# Patient Record
Sex: Female | Born: 1980 | Race: Black or African American | Hispanic: No | Marital: Single | Smoking: Never smoker
Health system: Southern US, Community
[De-identification: ages and names within clinical notes are randomized; demographics above are authoritative.]

## PROBLEM LIST (undated history)

## (undated) DIAGNOSIS — E0500 Thyrotoxicosis with diffuse goiter without thyrotoxic crisis or storm: Secondary | ICD-10-CM

## (undated) HISTORY — PX: LEG SURGERY: SHX1003

## (undated) HISTORY — PX: HAND SURGERY: SHX662

---

## 2006-04-18 DIAGNOSIS — O24419 Gestational diabetes mellitus in pregnancy, unspecified control: Secondary | ICD-10-CM

## 2013-06-05 ENCOUNTER — Emergency Department: Payer: Self-pay | Admitting: Emergency Medicine

## 2015-04-19 DIAGNOSIS — E0500 Thyrotoxicosis with diffuse goiter without thyrotoxic crisis or storm: Secondary | ICD-10-CM

## 2015-04-19 HISTORY — DX: Thyrotoxicosis with diffuse goiter without thyrotoxic crisis or storm: E05.00

## 2017-12-04 ENCOUNTER — Other Ambulatory Visit (INDEPENDENT_AMBULATORY_CARE_PROVIDER_SITE_OTHER): Payer: Medicaid Other

## 2017-12-04 ENCOUNTER — Ambulatory Visit (INDEPENDENT_AMBULATORY_CARE_PROVIDER_SITE_OTHER): Payer: Medicaid Other | Admitting: Obstetrics and Gynecology

## 2017-12-04 VITALS — BP 144/88 | HR 71 | Ht 67.0 in | Wt 187.4 lb

## 2017-12-04 DIAGNOSIS — N8312 Corpus luteum cyst of left ovary: Secondary | ICD-10-CM

## 2017-12-04 DIAGNOSIS — Z3687 Encounter for antenatal screening for uncertain dates: Secondary | ICD-10-CM

## 2017-12-04 DIAGNOSIS — T7589XA Other specified effects of external causes, initial encounter: Secondary | ICD-10-CM

## 2017-12-04 DIAGNOSIS — O219 Vomiting of pregnancy, unspecified: Secondary | ICD-10-CM

## 2017-12-04 DIAGNOSIS — Z3A09 9 weeks gestation of pregnancy: Secondary | ICD-10-CM | POA: Diagnosis not present

## 2017-12-04 DIAGNOSIS — E0590 Thyrotoxicosis, unspecified without thyrotoxic crisis or storm: Secondary | ICD-10-CM

## 2017-12-04 DIAGNOSIS — O3411 Maternal care for benign tumor of corpus uteri, first trimester: Secondary | ICD-10-CM

## 2017-12-04 DIAGNOSIS — Z202 Contact with and (suspected) exposure to infections with a predominantly sexual mode of transmission: Secondary | ICD-10-CM

## 2017-12-04 DIAGNOSIS — Z3481 Encounter for supervision of other normal pregnancy, first trimester: Secondary | ICD-10-CM

## 2017-12-04 MED ORDER — DOXYLAMINE-PYRIDOXINE 10-10 MG PO TBEC: 10.0000 mg | DELAYED_RELEASE_TABLET | Freq: Every day | ORAL | 1 refills | Status: DC

## 2017-12-04 MED ORDER — CITRANATAL BLOOM 90-1 MG PO TABS: 1.0000 mg | ORAL_TABLET | Freq: Every day | ORAL | 11 refills | Status: DC

## 2017-12-04 NOTE — Progress Notes (Signed)
Aimee Nguyen presents for NOB nurse interview visit. Pregnancy confirmation done at [facility].  G- 6.  P-5005. LMP- 10/03/2017 EDD-06/20/2018. 8 6/7. Dating scan ordered.  Pregnancy education material explained and given. _POS__ cats in the home.  Toxo ordered. NOB labs ordered. Thyroid panel ordered d/t pt states her pcp says she has hyperthyroidism.  HIV labs and Drug screen were explained and ordered. PNV encouraged. Pt requests a small pnv. Citra natal bloom erx. Pos for N/V- diclegis erx. Last pap 2018 - wnl.  FMLA form signed. Financial policy reviewed. Genetic screening options discussed. Genetic testing: Unsure.  Pt may discuss with provider. Pt. To follow up with provider in _3_ weeks for NOB physical.  All questions answered.

## 2017-12-04 NOTE — Patient Instructions (Signed)
WHAT OB PATIENTS CAN EXPECT   Confirmation of pregnancy and ultrasound ordered if medically indicated-[redacted] weeks gestation  New OB (NOB) intake with nurse and New OB (NOB) labs- [redacted] weeks gestation  New OB (NOB) physical examination with provider- 11/[redacted] weeks gestation  Flu vaccine-[redacted] weeks gestation  Anatomy scan-[redacted] weeks gestation  Glucose tolerance test, blood work to test for anemia, T-dap vaccine-[redacted] weeks gestation  Vaginal swabs/cultures-STD/Group B strep-[redacted] weeks gestation  Appointments every 4 weeks until 28 weeks  Every 2 weeks from 28 weeks until 36 weeks  Weekly visits from 36 weeks until delivery  Morning Sickness Morning sickness is when you feel sick to your stomach (nauseous) during pregnancy. You may feel sick to your stomach and throw up (vomit). You may feel sick in the morning, but you can feel this way any time of day. Some women feel very sick to their stomach and cannot stop throwing up (hyperemesis gravidarum). Follow these instructions at home:  Only take medicines as told by your doctor.  Take multivitamins as told by your doctor. Taking multivitamins before getting pregnant can stop or lessen the harshness of morning sickness.  Eat dry toast or unsalted crackers before getting out of bed.  Eat 5 to 6 small meals a day.  Eat dry and bland foods like rice and baked potatoes.  Do not drink liquids with meals. Drink between meals.  Do not eat greasy, fatty, or spicy foods.  Have someone cook for you if the smell of food causes you to feel sick or throw up.  If you feel sick to your stomach after taking prenatal vitamins, take them at night or with a snack.  Eat protein when you need a snack (nuts, yogurt, cheese).  Eat unsweetened gelatins for dessert.  Wear a bracelet used for sea sickness (acupressure wristband).  Go to a doctor that puts thin needles into certain body points (acupuncture) to improve how you feel.  Do not smoke.  Use a  humidifier to keep the air in your house free of odors.  Get lots of fresh air. Contact a doctor if:  You need medicine to feel better.  You feel dizzy or lightheaded.  You are losing weight. Get help right away if:  You feel very sick to your stomach and cannot stop throwing up.  You pass out (faint). This information is not intended to replace advice given to you by your health care provider. Make sure you discuss any questions you have with your health care provider. Document Released: 05/12/2004 Document Revised: 09/10/2015 Document Reviewed: 09/19/2012 Elsevier Interactive Patient Education  2017 [employer]. How a Baby Grows During Pregnancy Pregnancy begins when a female's sperm enters a female's egg (fertilization). This happens in one of the tubes (fallopian tubes) that connect the ovaries to the womb (uterus). The fertilized egg is called an embryo until it reaches 10 weeks. From 10 weeks until birth, it is called a fetus. The fertilized egg moves down the fallopian tube to the uterus. Then it implants into the lining of the uterus and begins to grow. The developing fetus receives oxygen and nutrients through the pregnant woman's bloodstream and the tissues that grow (placenta) to support the fetus. The placenta is the life support system for the fetus. It provides nutrition and removes waste. Learning as much as you can about your pregnancy and how your baby is developing can help you enjoy the experience. It can also make you aware of when there might be a problem  and when to ask questions. How long does a typical pregnancy last? A pregnancy usually lasts 280 days, or about 40 weeks. Pregnancy is divided into three trimesters:  First trimester: 0-13 weeks.  Second trimester: 14-27 weeks.  Third trimester: 28-40 weeks.  The day when your baby is considered ready to be born (full term) is your estimated date of delivery. How does my baby develop month by month? First  month  The fertilized egg attaches to the inside of the uterus.  Some cells will form the placenta. Others will form the fetus.  The arms, legs, brain, spinal cord, lungs, and heart begin to develop.  At the end of the first month, the heart begins to beat.  Second month  The bones, inner ear, eyelids, hands, and feet form.  The genitals develop.  By the end of 8 weeks, all major organs are developing.  Third month  All of the internal organs are forming.  Teeth develop below the gums.  Bones and muscles begin to grow. The spine can flex.  The skin is transparent.  Fingernails and toenails begin to form.  Arms and legs continue to grow longer, and hands and feet develop.  The fetus is about 3 in (7.6 cm) long.  Fourth month  The placenta is completely formed.  The external sex organs, neck, outer ear, eyebrows, eyelids, and fingernails are formed.  The fetus can hear, swallow, and move its arms and legs.  The kidneys begin to produce urine.  The skin is covered with a white waxy coating (vernix) and very fine hair (lanugo).  Fifth month  The fetus moves around more and can be felt for the first time (quickening).  The fetus starts to sleep and wake up and may begin to suck its finger.  The nails grow to the end of the fingers.  The organ in the digestive system that makes bile (gallbladder) functions and helps to digest the nutrients.  If your baby is a girl, eggs are present in her ovaries. If your baby is a boy, testicles start to move down into his scrotum.  Sixth month  The lungs are formed, but the fetus is not yet able to breathe.  The eyes open. The brain continues to develop.  Your baby has fingerprints and toe prints. Your baby's hair grows thicker.  At the end of the second trimester, the fetus is about 9 in (22.9 cm) long.  Seventh month  The fetus kicks and stretches.  The eyes are developed enough to sense changes in light.  The  hands can make a grasping motion.  The fetus responds to sound.  Eighth month  All organs and body systems are fully developed and functioning.  Bones harden and taste buds develop. The fetus may hiccup.  Certain areas of the brain are still developing. The skull remains soft.  Ninth month  The fetus gains about  lb (0.23 kg) each week.  The lungs are fully developed.  Patterns of sleep develop.  The fetus's head typically moves into a head-down position (vertex) in the uterus to prepare for birth. If the buttocks move into a vertex position instead, the baby is breech.  The fetus weighs 6-9 lbs (2.72-4.08 kg) and is 19-20 in (48.26-50.8 cm) long.  What can I do to have a healthy pregnancy and help my baby develop? Eating and Drinking  Eat a healthy diet. ? Talk with your health care provider to make sure that you are getting the  nutrients that you and your baby need. ? Visit www.BuildDNA.es to learn about creating a healthy diet.  Gain a healthy amount of weight during pregnancy as advised by your health care provider. This is usually 25-35 pounds. You may need to: ? Gain more if you were underweight before getting pregnant or if you are pregnant with more than one baby. ? Gain less if you were overweight or obese when you got pregnant.  Medicines and Vitamins  Take prenatal vitamins as directed by your health care provider. These include vitamins such as folic acid, iron, calcium, and vitamin D. They are important for healthy development.  Take medicines only as directed by your health care provider. Read labels and ask a pharmacist or your health care provider whether over-the-counter medicines, supplements, and prescription drugs are safe to take during pregnancy.  Activities  Be physically active as advised by your health care provider. Ask your health care provider to recommend activities that are safe for you to do, such as walking or swimming.  Do not  participate in strenuous or extreme sports.  Lifestyle  Do not drink alcohol.  Do not use any tobacco products, including cigarettes, chewing tobacco, or electronic cigarettes. If you need help quitting, ask your health care provider.  Do not use illegal drugs.  Safety  Avoid exposure to mercury, lead, or other heavy metals. Ask your health care provider about common sources of these heavy metals.  Avoid listeria infection during pregnancy. Follow these precautions: ? Do not eat soft cheeses or deli meats. ? Do not eat hot dogs unless they have been warmed up to the point of steaming, such as in the microwave oven. ? Do not drink unpasteurized milk.  Avoid toxoplasmosis infection during pregnancy. Follow these precautions: ? Do not change your cat's litter box, if you have a cat. Ask someone else to do this for you. ? Wear gardening gloves while working in the yard.  General Instructions  Keep all follow-up visits as directed by your health care provider. This is important. This includes prenatal care and screening tests.  Manage any chronic health conditions. Work closely with your health care provider to keep conditions, such as diabetes, under control.  How do I know if my baby is developing well? At each prenatal visit, your health care provider will do several different tests to check on your health and keep track of your baby's development. These include:  Fundal height. ? Your health care provider will measure your growing belly from top to bottom using a tape measure. ? Your health care provider will also feel your belly to determine your baby's position.  Heartbeat. ? An ultrasound in the first trimester can confirm pregnancy and show a heartbeat, depending on how far along you are. ? Your health care provider will check your baby's heart rate at every prenatal visit. ? As you get closer to your delivery date, you may have regular fetal heart rate monitoring to make  sure that your baby is not in distress.  Second trimester ultrasound. ? This ultrasound checks your baby's development. It also indicates your baby's gender.  What should I do if I have concerns about my baby's development? Always talk with your health care provider about any concerns that you may have. This information is not intended to replace advice given to you by your health care provider. Make sure you discuss any questions you have with your health care provider. Document Released: 09/21/2007 Document Revised: 09/10/2015 Document Reviewed:  09/11/2013 Elsevier Interactive Patient Education  2018 [place] Trimester of Pregnancy The first trimester of pregnancy is from week 1 until the end of week 13 (months 1 through 3). A week after a sperm fertilizes an egg, the egg will implant on the wall of the uterus. This embryo will begin to develop into a baby. Genes from you and your partner will form the baby. The female genes will determine whether the baby will be a boy or a girl. At 6-8 weeks, the eyes and face will be formed, and the heartbeat can be seen on ultrasound. At the end of 12 weeks, all the baby's organs will be formed. Now that you are pregnant, you will want to do everything you can to have a healthy baby. Two of the most important things are to get good prenatal care and to follow your health care provider's instructions. Prenatal care is all the medical care you receive before the baby's birth. This care will help prevent, find, and treat any problems during the pregnancy and childbirth. Body changes during your first trimester Your body goes through many changes during pregnancy. The changes vary from woman to woman.  You may gain or lose a couple of pounds at first.  You may feel sick to your stomach (nauseous) and you may throw up (vomit). If the vomiting is uncontrollable, call your health care provider.  You may tire easily.  You may develop headaches that can  be relieved by medicines. All medicines should be approved by your health care provider.  You may urinate more often. Painful urination may mean you have a bladder infection.  You may develop heartburn as a result of your pregnancy.  You may develop constipation because certain hormones are causing the muscles that push stool through your intestines to slow down.  You may develop hemorrhoids or swollen veins (varicose veins).  Your breasts may begin to grow larger and become tender. Your nipples may stick out more, and the tissue that surrounds them (areola) may become darker.  Your gums may bleed and may be sensitive to brushing and flossing.  Dark spots or blotches (chloasma, mask of pregnancy) may develop on your face. This will likely fade after the baby is born.  Your menstrual periods will stop.  You may have a loss of appetite.  You may develop cravings for certain kinds of food.  You may have changes in your emotions from day to day, such as being excited to be pregnant or being concerned that something may go wrong with the pregnancy and baby.  You may have more vivid and strange dreams.  You may have changes in your hair. These can include thickening of your hair, rapid growth, and changes in texture. Some women also have hair loss during or after pregnancy, or hair that feels dry or thin. Your hair will most likely return to normal after your baby is born.  What to expect at prenatal visits During a routine prenatal visit:  You will be weighed to make sure you and the baby are growing normally.  Your blood pressure will be taken.  Your abdomen will be measured to track your baby's growth.  The fetal heartbeat will be listened to between weeks 10 and 14 of your pregnancy.  Test results from any previous visits will be discussed.  Your health care provider may ask you:  How you are feeling.  If you are feeling the baby move.  If you have had any  abnormal  symptoms, such as leaking fluid, bleeding, severe headaches, or abdominal cramping.  If you are using any tobacco products, including cigarettes, chewing tobacco, and electronic cigarettes.  If you have any questions.  Other tests that may be performed during your first trimester include:  Blood tests to find your blood type and to check for the presence of any previous infections. The tests will also be used to check for low iron levels (anemia) and protein on red blood cells (Rh antibodies). Depending on your risk factors, or if you previously had diabetes during pregnancy, you may have tests to check for high blood sugar that affects pregnant women (gestational diabetes).  Urine tests to check for infections, diabetes, or protein in the urine.  An ultrasound to confirm the proper growth and development of the baby.  Fetal screens for spinal cord problems (spina bifida) and Down syndrome.  HIV (human immunodeficiency virus) testing. Routine prenatal testing includes screening for HIV, unless you choose not to have this test.  You may need other tests to make sure you and the baby are doing well.  Follow these instructions at home: Medicines  Follow your health care provider's instructions regarding medicine use. Specific medicines may be either safe or unsafe to take during pregnancy.  Take a prenatal vitamin that contains at least 600 micrograms (mcg) of folic acid.  If you develop constipation, try taking a stool softener if your health care provider approves. Eating and drinking  Eat a balanced diet that includes fresh fruits and vegetables, whole grains, good sources of protein such as meat, eggs, or tofu, and low-fat dairy. Your health care provider will help you determine the amount of weight gain that is right for you.  Avoid raw meat and uncooked cheese. These carry germs that can cause birth defects in the baby.  Eating four or five small meals rather than three large  meals a day may help relieve nausea and vomiting. If you start to feel nauseous, eating a few soda crackers can be helpful. Drinking liquids between meals, instead of during meals, also seems to help ease nausea and vomiting.  Limit foods that are high in fat and processed sugars, such as fried and sweet foods.  To prevent constipation: ? Eat foods that are high in fiber, such as fresh fruits and vegetables, whole grains, and beans. ? Drink enough fluid to keep your urine clear or pale yellow. Activity  Exercise only as directed by your health care provider. Most women can continue their usual exercise routine during pregnancy. Try to exercise for 30 minutes at least 5 days a week. Exercising will help you: ? Control your weight. ? Stay in shape. ? Be prepared for labor and delivery.  Experiencing pain or cramping in the lower abdomen or lower back is a good sign that you should stop exercising. Check with your health care provider before continuing with normal exercises.  Try to avoid standing for long periods of time. Move your legs often if you must stand in one place for a long time.  Avoid heavy lifting.  Wear low-heeled shoes and practice good posture.  You may continue to have sex unless your health care provider tells you not to. Relieving pain and discomfort  Wear a good support bra to relieve breast tenderness.  Take warm sitz baths to soothe any pain or discomfort caused by hemorrhoids. Use hemorrhoid cream if your health care provider approves.  Rest with your legs elevated if you have leg   cramps or low back pain.  If you develop varicose veins in your legs, wear support hose. Elevate your feet for 15 minutes, 3-4 times a day. Limit salt in your diet. Prenatal care  Schedule your prenatal visits by the twelfth week of pregnancy. They are usually scheduled monthly at first, then more often in the last 2 months before delivery.  Write down your questions. Take them to  your prenatal visits.  Keep all your prenatal visits as told by your health care provider. This is important. Safety  Wear your seat belt at all times when driving.  Make a list of emergency phone numbers, including numbers for family, friends, the hospital, and police and fire departments. General instructions  Ask your health care provider for a referral to a local prenatal education class. Begin classes no later than the beginning of month 6 of your pregnancy.  Ask for help if you have counseling or nutritional needs during pregnancy. Your health care provider can offer advice or refer you to specialists for help with various needs.  Do not use hot tubs, steam rooms, or saunas.  Do not douche or use tampons or scented sanitary pads.  Do not cross your legs for long periods of time.  Avoid cat litter boxes and soil used by cats. These carry germs that can cause birth defects in the baby and possibly loss of the fetus by miscarriage or stillbirth.  Avoid all smoking, herbs, alcohol, and medicines not prescribed by your health care provider. Chemicals in these products affect the formation and growth of the baby.  Do not use any products that contain nicotine or tobacco, such as cigarettes and e-cigarettes. If you need help quitting, ask your health care provider. You may receive counseling support and other resources to help you quit.  Schedule a dentist appointment. At home, brush your teeth with a soft toothbrush and be gentle when you floss. Contact a health care provider if:  You have dizziness.  You have mild pelvic cramps, pelvic pressure, or nagging pain in the abdominal area.  You have persistent nausea, vomiting, or diarrhea.  You have a bad smelling vaginal discharge.  You have pain when you urinate.  You notice increased swelling in your face, hands, legs, or ankles.  You are exposed to fifth disease or chickenpox.  You are exposed to German measles (rubella)  and have never had it. Get help right away if:  You have a fever.  You are leaking fluid from your vagina.  You have spotting or bleeding from your vagina.  You have severe abdominal cramping or pain.  You have rapid weight gain or loss.  You vomit blood or material that looks like coffee grounds.  You develop a severe headache.  You have shortness of breath.  You have any kind of trauma, such as from a fall or a car accident. Summary  The first trimester of pregnancy is from week 1 until the end of week 13 (months 1 through 3).  Your body goes through many changes during pregnancy. The changes vary from woman to woman.  You will have routine prenatal visits. During those visits, your health care provider will examine you, discuss any test results you may have, and talk with you about how you are feeling. This information is not intended to replace advice given to you by your health care provider. Make sure you discuss any questions you have with your health care provider. Document Released: 03/29/2001 Document Revised: 03/16/2016 Document   Reviewed: 03/16/2016 Elsevier Interactive Patient Education  2018 [employer]. Common Medications Safe in Pregnancy  Acne:      Constipation:  Benzoyl Peroxide     Colace  Clindamycin      Dulcolax Suppository  Topica Erythromycin     Fibercon  Salicylic Acid      Metamucil         Miralax AVOID:        Senakot   Accutane    Cough:  Retin-A       Cough Drops  Tetracycline      Phenergan w/ Codeine if Rx  Minocycline      Robitussin (Plain & DM)  Antibiotics:     Crabs/Lice:  Ceclor       RID  Cephalosporins    AVOID:  E-Mycins      Kwell  Keflex  Macrobid/Macrodantin   Diarrhea:  Penicillin      Kao-Pectate  Zithromax      Imodium AD         PUSH FLUIDS AVOID:       Cipro     Fever:  Tetracycline      Tylenol (Regular or Extra  Minocycline       Strength)  Levaquin      Extra Strength-Do not          Exceed 8 tabs/24  hrs Caffeine:        '200mg'$ /day (equiv. To 1 cup of coffee or  approx. 3 12 oz sodas)         Gas: Cold/Hayfever:       Gas-X  Benadryl      Mylicon  Claritin       Phazyme  **Claritin-D        Chlor-Trimeton    Headaches:  Dimetapp      ASA-Free Excedrin  Drixoral-Non-Drowsy     Cold Compress  Mucinex (Guaifenasin)     Tylenol (Regular or Extra  Sudafed/Sudafed-12 Hour     Strength)  **Sudafed PE Pseudoephedrine   Tylenol Cold & Sinus     Vicks Vapor Rub  Zyrtec  **AVOID if Problems With Blood Pressure         Heartburn: Avoid lying down for at least 1 hour after meals  Aciphex      Maalox     Rash:  Milk of Magnesia     Benadryl    Mylanta       1% Hydrocortisone Cream  Pepcid  Pepcid Complete   Sleep Aids:  Prevacid      Ambien   Prilosec       Benadryl  Rolaids       Chamomile Tea  Tums (Limit 4/day)     Unisom  Zantac       Tylenol PM         Warm milk-add vanilla or  Hemorrhoids:       Sugar for taste  Anusol/Anusol H.C.  (RX: Analapram 2.5%)  Sugar Substitutes:  Hydrocortisone OTC     Ok in moderation  Preparation H      Tucks        Vaseline lotion applied to tissue with wiping    Herpes:     Throat:  Acyclovir      Oragel  Famvir  Valtrex     Vaccines:         Flu Shot Leg Cramps:       *Gardasil  Benadryl      Hepatitis A  Hepatitis B Nasal Spray:       Pneumovax  Saline Nasal Spray     Polio Booster         Tetanus Nausea:       Tuberculosis test or PPD  Vitamin B6 25 mg TID   AVOID:    Dramamine      *Gardasil  Emetrol       Live Poliovirus  Ginger Root 250 mg QID    MMR (measles, mumps &  High Complex Carbs @ Bedtime    rebella)  Sea Bands-Accupressure    Varicella (Chickenpox)  Unisom 1/2 tab TID     *No known complications           If received before Pain:         Known pregnancy;   Darvocet       Resume series after  Lortab        Delivery  Percocet    Yeast:   Tramadol      Femstat  Tylenol  3      Gyne-lotrimin  Ultram       Monistat  Vicodin           MISC:         All Sunscreens           Hair Coloring/highlights          Insect Repellant's          (Including DEET)         [facility]  [place], [place], [place] [address]  Phone: [phone]   [facility] (second location)  [address] [place], [place] [address]  Phone: [phone]   [facility] [place], [place], [place] [address] Phone: [phone]   [facility] [address] [place], [place] [address]  Phone: [phone]

## 2017-12-05 LAB — ABO AND RH: Rh Factor: POSITIVE

## 2017-12-05 LAB — CBC WITH DIFFERENTIAL/PLATELET
BASOS ABS: 0 10*3/uL (ref 0.0–0.2)
Basos: 0 %
EOS (ABSOLUTE): 0 10*3/uL (ref 0.0–0.4)
Eos: 1 %
HEMOGLOBIN: 12.9 g/dL (ref 11.1–15.9)
Hematocrit: 37.4 % (ref 34.0–46.6)
IMMATURE GRANS (ABS): 0 10*3/uL (ref 0.0–0.1)
Immature Granulocytes: 0 %
LYMPHS: 32 %
Lymphocytes Absolute: 1.8 10*3/uL (ref 0.7–3.1)
MCH: 29.8 pg (ref 26.6–33.0)
MCHC: 34.5 g/dL (ref 31.5–35.7)
MCV: 86 fL (ref 79–97)
MONOCYTES: 9 %
Monocytes Absolute: 0.5 10*3/uL (ref 0.1–0.9)
Neutrophils Absolute: 3.3 10*3/uL (ref 1.4–7.0)
Neutrophils: 58 %
Platelets: 236 10*3/uL (ref 150–450)
RBC: 4.33 x10E6/uL (ref 3.77–5.28)
RDW: 13.5 % (ref 12.3–15.4)
WBC: 5.7 10*3/uL (ref 3.4–10.8)

## 2017-12-05 LAB — MICROSCOPIC EXAMINATION: Casts: NONE SEEN /lpf

## 2017-12-05 LAB — HIV ANTIBODY (ROUTINE TESTING W REFLEX): HIV SCREEN 4TH GENERATION: NONREACTIVE

## 2017-12-05 LAB — URINALYSIS, ROUTINE W REFLEX MICROSCOPIC
BILIRUBIN UA: NEGATIVE
Glucose, UA: NEGATIVE
Leukocytes, UA: NEGATIVE
NITRITE UA: NEGATIVE
PH UA: 6 (ref 5.0–7.5)
RBC UA: NEGATIVE
Specific Gravity, UA: 1.019 (ref 1.005–1.030)
Urobilinogen, Ur: 0.2 mg/dL (ref 0.2–1.0)

## 2017-12-05 LAB — TOXOPLASMA ANTIBODIES- IGG AND  IGM
Toxoplasma Antibody- IgM: <3 AU/mL (ref 0.0–7.9)
Toxoplasma IgG Ratio: <3 IU/mL (ref 0.0–7.1)

## 2017-12-05 LAB — THYROID PANEL WITH TSH
Free Thyroxine Index: >12.5 — ABNORMAL HIGH (ref 1.2–4.9)
T3 Uptake Ratio: 50 % — ABNORMAL HIGH (ref 24–39)
T4, Total: >24.9 ug/dL (ref 4.5–12.0)
TSH: <0.006 u[IU]/mL — ABNORMAL LOW (ref 0.450–4.500)

## 2017-12-05 LAB — GC/CHLAMYDIA PROBE AMP
CHLAMYDIA, DNA PROBE: NEGATIVE
NEISSERIA GONORRHOEAE BY PCR: NEGATIVE

## 2017-12-05 LAB — HEPATITIS B SURFACE ANTIGEN: Hepatitis B Surface Ag: NEGATIVE

## 2017-12-05 LAB — VARICELLA ZOSTER ANTIBODY, IGG: Varicella zoster IgG: 1984 index (ref >165)

## 2017-12-05 LAB — RUBELLA SCREEN: Rubella Antibodies, IGG: 8.37 index (ref >0.99)

## 2017-12-05 LAB — SICKLE CELL SCREEN: SICKLE CELL SCREEN: NEGATIVE

## 2017-12-05 LAB — ANTIBODY SCREEN: ANTIBODY SCREEN: NEGATIVE

## 2017-12-05 LAB — RPR: RPR: NONREACTIVE

## 2017-12-06 ENCOUNTER — Telehealth: Payer: Self-pay | Admitting: Obstetrics and Gynecology

## 2017-12-06 ENCOUNTER — Encounter: Payer: Self-pay | Admitting: Obstetrics and Gynecology

## 2017-12-06 NOTE — Telephone Encounter (Signed)
The patient states when she went to pick up her nausea medication at the pharmacy her ins did not cover it and it was over $300.  She is requesting a different medication be sent to her pharmacy.  The pharmacy did not change.  Her contact number is [phone] and does have voice mail set up.  Please advise, thanks.

## 2017-12-06 NOTE — Telephone Encounter (Signed)
Informed pt that Rx for diclegis needs a prior authorization to be covered by insurance and hopefully this will be complete tomorrow. Will call and update patient then.

## 2017-12-07 LAB — CULTURE, OB URINE

## 2017-12-07 LAB — URINE CULTURE, OB REFLEX

## 2017-12-08 LAB — DRUG PROFILE, UR, 9 DRUGS (LABCORP)
Amphetamines, Urine: NEGATIVE ng/mL
Barbiturate Quant, Ur: NEGATIVE ng/mL
Benzodiazepine Quant, Ur: NEGATIVE ng/mL
CANNABINOID QUANT UR: POSITIVE — AB
Cocaine (Metab.): NEGATIVE ng/mL
Methadone Screen, Urine: NEGATIVE ng/mL
OPIATE QUANT UR: NEGATIVE ng/mL
PCP QUANT UR: NEGATIVE ng/mL
Propoxyphene: NEGATIVE ng/mL

## 2017-12-08 LAB — NICOTINE SCREEN, URINE: Cotinine Ql Scrn, Ur: NEGATIVE ng/mL

## 2017-12-11 ENCOUNTER — Other Ambulatory Visit: Payer: Self-pay

## 2017-12-11 ENCOUNTER — Telehealth: Payer: Self-pay

## 2017-12-11 DIAGNOSIS — N3000 Acute cystitis without hematuria: Secondary | ICD-10-CM

## 2017-12-11 MED ORDER — NITROFURANTOIN MONOHYD MACRO 100 MG PO CAPS: 100.0000 mg | ORAL_CAPSULE | Freq: Two times a day (BID) | ORAL | 0 refills | Status: DC

## 2017-12-11 NOTE — Telephone Encounter (Signed)
Called to inform pt of UTI and Rx at pharmacy. Pt's number is not working at this time.

## 2017-12-11 NOTE — Progress Notes (Signed)
Clinda resisitant

## 2017-12-11 NOTE — Telephone Encounter (Signed)
Pt's phone is not in service.

## 2017-12-12 ENCOUNTER — Telehealth: Payer: Self-pay

## 2017-12-12 NOTE — Telephone Encounter (Signed)
Pt has a non working number.

## 2017-12-13 ENCOUNTER — Other Ambulatory Visit: Payer: Self-pay

## 2017-12-25 ENCOUNTER — Other Ambulatory Visit: Payer: Self-pay | Admitting: Obstetrics and Gynecology

## 2017-12-25 ENCOUNTER — Telehealth: Payer: Self-pay | Admitting: Obstetrics and Gynecology

## 2017-12-25 ENCOUNTER — Ambulatory Visit (INDEPENDENT_AMBULATORY_CARE_PROVIDER_SITE_OTHER): Payer: Medicaid Other

## 2017-12-25 DIAGNOSIS — O209 Hemorrhage in early pregnancy, unspecified: Secondary | ICD-10-CM

## 2017-12-25 DIAGNOSIS — O3411 Maternal care for benign tumor of corpus uteri, first trimester: Secondary | ICD-10-CM | POA: Diagnosis not present

## 2017-12-25 DIAGNOSIS — N8312 Corpus luteum cyst of left ovary: Secondary | ICD-10-CM | POA: Diagnosis not present

## 2017-12-25 DIAGNOSIS — Z3A12 12 weeks gestation of pregnancy: Secondary | ICD-10-CM

## 2017-12-25 NOTE — Telephone Encounter (Signed)
Spoke with pt and AC advised for pt to come in for u/s to check the baby to make sure everything is okay. Pt made an appt for an u/s for today.

## 2017-12-25 NOTE — Telephone Encounter (Signed)
Unable to reach pt. Will inform her of UTI and Rx during her appt tomorrow.

## 2017-12-25 NOTE — Telephone Encounter (Signed)
The patient called and stated that she is bleeding a little more than usual and that she has started clotting.The patient would like to know what she needs to do, Pt stated she is scared and concerned. Please advise.

## 2017-12-26 ENCOUNTER — Other Ambulatory Visit (HOSPITAL_COMMUNITY)
Admission: RE | Admit: 2017-12-26 | Discharge: 2017-12-26 | Disposition: A | Discharge status: Home / Self Care | Payer: Medicaid Other | Source: Ambulatory Visit | Attending: Obstetrics and Gynecology | Admitting: Obstetrics and Gynecology

## 2017-12-26 ENCOUNTER — Encounter: Payer: Self-pay | Admitting: Obstetrics and Gynecology

## 2017-12-26 ENCOUNTER — Ambulatory Visit (INDEPENDENT_AMBULATORY_CARE_PROVIDER_SITE_OTHER): Payer: Medicaid Other | Admitting: Obstetrics and Gynecology

## 2017-12-26 VITALS — BP 128/79 | HR 88 | Wt 187.0 lb

## 2017-12-26 DIAGNOSIS — E0500 Thyrotoxicosis with diffuse goiter without thyrotoxic crisis or storm: Secondary | ICD-10-CM

## 2017-12-26 DIAGNOSIS — O09521 Supervision of elderly multigravida, first trimester: Secondary | ICD-10-CM

## 2017-12-26 DIAGNOSIS — Z3481 Encounter for supervision of other normal pregnancy, first trimester: Secondary | ICD-10-CM | POA: Diagnosis not present

## 2017-12-26 DIAGNOSIS — O209 Hemorrhage in early pregnancy, unspecified: Secondary | ICD-10-CM | POA: Diagnosis not present

## 2017-12-26 DIAGNOSIS — O09291 Supervision of pregnancy with other poor reproductive or obstetric history, first trimester: Secondary | ICD-10-CM

## 2017-12-26 DIAGNOSIS — O99281 Endocrine, nutritional and metabolic diseases complicating pregnancy, first trimester: Secondary | ICD-10-CM

## 2017-12-26 DIAGNOSIS — Z3A01 Less than 8 weeks gestation of pregnancy: Secondary | ICD-10-CM

## 2017-12-26 DIAGNOSIS — Z8632 Personal history of gestational diabetes: Secondary | ICD-10-CM

## 2017-12-26 DIAGNOSIS — O09299 Supervision of pregnancy with other poor reproductive or obstetric history, unspecified trimester: Secondary | ICD-10-CM

## 2017-12-26 LAB — POCT URINALYSIS DIPSTICK OB
Bilirubin, UA: NEGATIVE
GLUCOSE, UA: NEGATIVE
Ketones, UA: NEGATIVE
LEUKOCYTES UA: NEGATIVE
NITRITE UA: NEGATIVE
PH UA: 8 (ref 5.0–8.0)
RBC UA: NEGATIVE
UROBILINOGEN UA: 0.2 U/dL

## 2017-12-26 NOTE — Progress Notes (Signed)
Pt presents today for NOB PE. Pt states she is doing well, other than slight spotting. Last pap during the Summer of 2018.

## 2017-12-26 NOTE — Progress Notes (Signed)
NOB: She presents today for her new OB exam.  She states that she had spotting earlier this week but it is much less at this time.  She had an ultrasound yesterday revealing a viable intrauterine pregnancy with no evidence of subchorionic hemorrhage.  She states that she has now stopped intercourse. Her previous medical history and obstetric history includes advanced maternal age, history of gestational diabetes, current history of Graves' disease untreated, history of a child with tetralogy of Fallot.  Physical examination General NAD, Conversant  HEENT Atraumatic; Op clear with mmm.  Normo-cephalic. Pupils reactive. Anicteric sclerae  Thyroid/Neck Smooth without nodularity or enlargement. Normal ROM.  Neck Supple.  Skin No rashes, lesions or ulceration. Normal palpated skin turgor. No nodularity.  Breasts: No masses or discharge.  Symmetric.  No axillary adenopathy.  Lungs: Clear to auscultation.No rales or wheezes. Normal Respiratory effort, no retractions.  Heart: NSR.  No murmurs or rubs appreciated. No periferal edema  Abdomen: Soft.  Non-tender.  No masses.  No HSM. No hernia  Extremities: Moves all appropriately.  Normal ROM for age. No lymphadenopathy.  Neuro: Oriented to PPT.  Normal mood. Normal affect.     Pelvic:   Vulva: Normal appearance.  No lesions.  Vagina: No lesions or abnormalities noted.  Support: Normal pelvic support.  Urethra No masses tenderness or scarring.  Meatus Normal size without lesions or prolapse.  Cervix: Normal appearance.  No lesions.  Anus: Normal exam.  No lesions.  Perineum: Normal exam.  No lesions.        Bimanual   Adnexae: No masses.  Non-tender to palpation.  Uterus: Enlarged. 14 wks  Non-tender.  Mobile.  AV.  Adnexae: No masses.  Non-tender to palpation.  Cul-de-sac: Negative for abnormality.  Adnexae: No masses.  Non-tender to palpation.         Pelvimetry   Diagonal: Reached.  Spines: Average.  Sacrum: Concave.  Pubic Arch: Normal.    Assessment: Advanced maternal age, Luiz Blare' disease, history of child with cardiac disease, history of gestational diabetes.  Plan: Early 1 hour GCT, MaterniT 21 today, thyroid panel, refer to maternal-fetal medicine, consider AFP for spina bifida at next visit.

## 2017-12-27 LAB — THYROID PANEL WITH TSH
Free Thyroxine Index: >10.5 — ABNORMAL HIGH (ref 1.2–4.9)
T3 UPTAKE RATIO: 42 % — AB (ref 24–39)
TSH: <0.006 u[IU]/mL — ABNORMAL LOW (ref 0.450–4.500)

## 2017-12-31 LAB — MATERNIT21  PLUS CORE+ESS+SCA, BLOOD
CHROMOSOME 13: NEGATIVE
CHROMOSOME 18: NEGATIVE
Chromosome 21: NEGATIVE
Y Chromosome: NOT DETECTED

## 2018-01-01 LAB — CYTOLOGY - PAP
Chlamydia: NEGATIVE
Diagnosis: NEGATIVE
HPV: NOT DETECTED
NEISSERIA GONORRHEA: NEGATIVE

## 2018-01-08 ENCOUNTER — Telehealth: Payer: Self-pay

## 2018-01-08 ENCOUNTER — Ambulatory Visit: Payer: Medicaid Other

## 2018-01-08 NOTE — Telephone Encounter (Signed)
Pt returned call. Pt was informed of Genetic testing results and gender (female). Pt was very pleased.

## 2018-01-08 NOTE — Telephone Encounter (Signed)
Left message for pt to return call regarding results of genetic testing.

## 2018-01-23 ENCOUNTER — Encounter: Payer: Medicaid Other | Admitting: Obstetrics and Gynecology

## 2018-02-09 ENCOUNTER — Ambulatory Visit (INDEPENDENT_AMBULATORY_CARE_PROVIDER_SITE_OTHER): Payer: Medicaid Other | Admitting: Obstetrics and Gynecology

## 2018-02-09 ENCOUNTER — Other Ambulatory Visit: Payer: Self-pay

## 2018-02-09 ENCOUNTER — Encounter: Payer: Self-pay | Admitting: Obstetrics and Gynecology

## 2018-02-09 VITALS — BP 135/72 | HR 73 | Wt 196.0 lb

## 2018-02-09 DIAGNOSIS — E0500 Thyrotoxicosis with diffuse goiter without thyrotoxic crisis or storm: Secondary | ICD-10-CM

## 2018-02-09 DIAGNOSIS — O09521 Supervision of elderly multigravida, first trimester: Secondary | ICD-10-CM

## 2018-02-09 DIAGNOSIS — Z202 Contact with and (suspected) exposure to infections with a predominantly sexual mode of transmission: Secondary | ICD-10-CM

## 2018-02-09 DIAGNOSIS — Z3481 Encounter for supervision of other normal pregnancy, first trimester: Secondary | ICD-10-CM

## 2018-02-09 LAB — POCT URINALYSIS DIPSTICK OB
Bilirubin, UA: NEGATIVE
Glucose, UA: NEGATIVE
KETONES UA: NEGATIVE
Leukocytes, UA: NEGATIVE
NITRITE UA: NEGATIVE
PH UA: 6 (ref 5.0–8.0)
PROTEIN: NEGATIVE
SPEC GRAV UA: 1.02 (ref 1.010–1.025)
UROBILINOGEN UA: 0.2 U/dL

## 2018-02-09 MED ORDER — CITRANATAL BLOOM 90-1 MG PO TABS: 1.0000 mg | ORAL_TABLET | Freq: Every day | ORAL | 11 refills | Status: DC

## 2018-02-09 NOTE — Progress Notes (Signed)
ROB: Patient did not keep her MFM referral appointment because she had transportation issues.  We have rescheduled this today.  Patient desires AFP for spina bifida today.  FAS ultrasound scheduled in 2 weeks.

## 2018-02-09 NOTE — Addendum Note (Signed)
Addended by: Haskel Khan on: 02/09/2018 12:21 PM   Modules accepted: Orders

## 2018-02-09 NOTE — Progress Notes (Signed)
Pt presents for ROB. Pt feels well, but is very "tired".

## 2018-02-11 LAB — AFP, SERUM, OPEN SPINA BIFIDA
AFP MOM: 0.65
AFP VALUE AFPOSL: 26.1 ng/mL
Gest. Age on Collection Date: 18 weeks
MATERNAL AGE AT EDD: 40 a
OSBR Risk 1 IN: 10000
Test Results:: NEGATIVE
Weight: 196 [lb_av]

## 2018-02-19 ENCOUNTER — Ambulatory Visit
Admission: RE | Admit: 2018-02-19 | Discharge: 2018-02-19 | Disposition: A | Discharge status: Home / Self Care | Payer: Medicaid Other | Source: Ambulatory Visit | Attending: Maternal & Fetal Medicine | Admitting: Maternal & Fetal Medicine

## 2018-02-19 ENCOUNTER — Other Ambulatory Visit: Payer: Self-pay

## 2018-02-19 ENCOUNTER — Ambulatory Visit (HOSPITAL_BASED_OUTPATIENT_CLINIC_OR_DEPARTMENT_OTHER)
Admission: RE | Admit: 2018-02-19 | Discharge: 2018-02-19 | Disposition: A | Discharge status: Home / Self Care | Payer: Medicaid Other | Source: Ambulatory Visit | Attending: Maternal & Fetal Medicine | Admitting: Maternal & Fetal Medicine

## 2018-02-19 VITALS — BP 130/65 | HR 75 | Temp 98.7°F | Resp 17 | Ht 67.0 in | Wt 203.0 lb

## 2018-02-19 DIAGNOSIS — O09522 Supervision of elderly multigravida, second trimester: Secondary | ICD-10-CM

## 2018-02-19 DIAGNOSIS — Z3402 Encounter for supervision of normal first pregnancy, second trimester: Secondary | ICD-10-CM | POA: Diagnosis not present

## 2018-02-19 DIAGNOSIS — Z3A20 20 weeks gestation of pregnancy: Secondary | ICD-10-CM | POA: Diagnosis not present

## 2018-02-19 DIAGNOSIS — Z8279 Family history of other congenital malformations, deformations and chromosomal abnormalities: Secondary | ICD-10-CM

## 2018-02-19 DIAGNOSIS — O09523 Supervision of elderly multigravida, third trimester: Secondary | ICD-10-CM | POA: Insufficient documentation

## 2018-02-19 HISTORY — DX: Thyrotoxicosis with diffuse goiter without thyrotoxic crisis or storm: E05.00

## 2018-02-19 LAB — COMPREHENSIVE METABOLIC PANEL
ALBUMIN: 2.9 g/dL — AB (ref 3.5–5.0)
ALT: 14 U/L (ref 0–44)
AST: 16 U/L (ref 15–41)
Alkaline Phosphatase: 69 U/L (ref 38–126)
Anion gap: 8 (ref 5–15)
BUN: 12 mg/dL (ref 6–20)
CO2: 23 mmol/L (ref 22–32)
CREATININE: 0.37 mg/dL — AB (ref 0.44–1.00)
Calcium: 8.2 mg/dL — ABNORMAL LOW (ref 8.9–10.3)
Chloride: 108 mmol/L (ref 98–111)
GFR calc Af Amer: >60 mL/min (ref >60)
GFR calc non Af Amer: >60 mL/min (ref >60)
GLUCOSE: 97 mg/dL (ref 70–99)
Potassium: 3.6 mmol/L (ref 3.5–5.1)
SODIUM: 139 mmol/L (ref 135–145)
Total Bilirubin: 0.6 mg/dL (ref 0.3–1.2)
Total Protein: 6.7 g/dL (ref 6.5–8.1)

## 2018-02-19 MED ORDER — METHIMAZOLE 5 MG PO TABS: 5.0000 mg | ORAL_TABLET | Freq: Three times a day (TID) | ORAL | 3 refills | Status: DC

## 2018-02-19 NOTE — Progress Notes (Addendum)
Referring Provider:   [facility], Dr. Ivor Messier of Consultation: 30 minutes  Ms. Balsam was referred to [facility] for genetic counseling because of advanced maternal age.  The patient will be 37 years old at the time of delivery.  This note summarizes the information we discussed.    We explained that the chance of a chromosome abnormality increases with maternal age.  Chromosomes and examples of chromosome problems were reviewed.  Humans typically have 46 chromosomes in each cell, with half passed through each sperm and egg.  Any change in the number or structure of chromosomes can increase the risk of problems in the physical and mental development of a pregnancy.   Based upon age of the patient, the chance of any chromosome abnormality was 1 in 17. The chance of Down syndrome, the most common chromosome problem associated with maternal age, was 1 in 88.  The risk of chromosome problems is in addition to the 3% general population risk for birth defects and mental retardation.  The greatest chance, of course, is that the baby would be born in good health.  We discussed the following prenatal screening and testing options for this pregnancy:  Maternal serum marker screening, a blood test that measures pregnancy proteins, can provide risk assessments for Down syndrome, trisomy 18, and open neural tube defects (spina bifida, anencephaly). Because it does not directly examine the fetus, it cannot positively diagnose or rule out these problems.  An AFP was ordered by her OB and was within normal limits for open neural tube defects.  Targeted ultrasound uses high frequency sound waves to create an image of the developing fetus.  An ultrasound is often recommended as a routine means of evaluating the pregnancy.  It is also used to screen for fetal anatomy problems (for example, a heart defect) that might be suggestive of a chromosomal or other abnormality.    Amniocentesis involves the removal of a small amount of amniotic fluid from the sac surrounding the fetus with the use of a thin needle inserted through the maternal abdomen and uterus.  Ultrasound guidance is used throughout the procedure.  Fetal cells from amniotic fluid are directly evaluated and > 99.5% of chromosome problems and > 98% of open neural tube defects can be detected. This procedure is generally performed after the 15th week of pregnancy.  The main risks to this procedure include complications leading to miscarriage in less than 1 in 200 cases (0.5%).  We also reviewed the availability of cell free fetal DNA testing from maternal blood to determine whether or not the baby may have either Down syndrome, trisomy 36, or trisomy 36.  This test utilizes a maternal blood sample and DNA sequencing technology to isolate circulating cell free fetal DNA from maternal plasma.  The fetal DNA can then be analyzed for DNA sequences that are derived from the three most common chromosomes involved in aneuploidy, chromosomes 13, 18, and 21.  If the overall amount of DNA is greater than the expected level for any of these chromosomes, aneuploidy is suspected.  While we do not consider it a replacement for invasive testing and karyotype analysis, a negative result from this testing would be reassuring, though not a guarantee of a normal chromosome complement for the baby.  An abnormal result is certainly suggestive of an abnormal chromosome complement, though we would still recommend amniocentesis to confirm any findings from this testing.  Prior MaterniT21 testing was normal for Ms. Demarinis at her  OB.  No Y chromosome was present, consistent with a female fetus.  Cystic Fibrosis and Spinal Muscular Atrophy (SMA) screening were also discussed with the patient. Both conditions are recessive, which means that both parents must be carriers in order to have a child with the disease.  Cystic fibrosis (CF) is one of  the most common genetic conditions in persons of Caucasian ancestry.  This condition occurs in approximately 1 in 2,500 Caucasian persons and results in thickened secretions in the lungs, digestive, and reproductive systems.  For a baby to be at risk for having CF, both of the parents must be carriers for this condition.  Approximately 1 in 70 Caucasian persons is a carrier for CF.  Current carrier testing looks for the most common mutations in the gene for CF and can detect approximately 90% of carriers in the Caucasian population.  This means that the carrier screening can greatly reduce, but cannot eliminate, the chance for an individual to have a child with CF.  If an individual is found to be a carrier for CF, then carrier testing would be available for the partner. As part of [place]'s newborn screening profile, all babies born in the state of [place] will have a two-tier screening process.  Specimens are first tested to determine the concentration of immunoreactive trypsinogen (IRT).  The top 5% of specimens with the highest IRT values then undergo DNA testing using a panel of over 40 common CF mutations. SMA is a neurodegenerative disorder that leads to atrophy of skeletal muscle and overall weakness.  This condition is also more prevalent in the Caucasian population, with 1 in 40-1 in 60 persons being a carrier and 1 in 6,000-1 in 10,000 children being affected.  There are multiple forms of the disease, with some causing death in infancy to other forms with survival into adulthood.  The genetics of SMA is complex, but carrier screening can detect up to 95% of carriers in the Caucasian population.  Similar to CF, a negative result can greatly reduce, but cannot eliminate, the chance to have a child with SMA.  Ms. Mastro is of mixed Caucasian, African American and Native American ancestry and her partner is reported to be of African American background.  They are not consanguinous.  Prior  sickle cell screening was ordered by her OB.  This is the solubility screening which can only detect the presence of sickle cell (S) trait.  It was negative.  This testing cannot, however, detect other hemoglobin variants which may be of clinic significance.  For more complete testing, we offered a hemoglobinopathy profile (hemoglobin fractionation).  Her MCV is normal.   We obtained a detailed family history and pregnancy history.  Ms. Leclaire reported one son from a prior relationship who was born with Tetralogy of Fallot.  Following the 3 surgical procedures, he is in good health, with no other medical concerns or birth differences.  He does have some learning delays in school.  We discussed that congenital heart defects (CHDs) occur in approximately 1 in 200 births.  Congenital heart defects are often thought to be multifactorial, meaning due to complex interactions between genetic and environmental factors, although some specific genetic changes and syndromes are associated with congenital heart defects.  It would be important to know what tests were done on [place] in order to better assess the risk in this family.   In the absence of an underlying genetic condition, the chance of congenital heart defects in siblings of  individuals with tetralogy of Fallot is thought to be approximately 2-3%.  This number is likely reduced because this pregnancy is with a different partner.  Given this history, we would recommend a fetal echocardiogram at 22 to 24 weeks in this and subsequent pregnancies for Ms. Heese. The patient also reported a strong family history of cancer in her mother's family.  Three maternal aunts have been diagnosed with breast cancer and one with kidney cancer, one first cousin with lung cancer in his 4s, and her maternal grandfather passed away due to bone cancer per her report.  We reviewed that most cases of cancer occur by chance, but that when multiple family members are identified with  similar types of cancer, particularly at young ages, it is reasonable to consider possible genetic syndromes as the cause.  She believes that her mother mentioned a "cancer gene" in the family.  We encouraged her to speak to her family about this and to call us back if she would like to have the contact information to meet with a cancer genetic counselor about this history.  Surveillance recommendations and possible genetic testing could be reviewed at that time.  Lastly, her partner, Fayrene Fearing, has a maternal half brother with autism and another maternal half brother who has a daughter with autism. With regard to the family history of autism, we explained that the specific cause for this condition is often not well understood.  Autism spectrum disorders may be inherited in a mutifactorial manner (due to a combination of genetic factors and environmental factors) or may be a characteristic of an underlying genetic condition that follows a traditional Mendelian inheritance pattern such as Fragile X syndrome. Without medical information about possible testing in these relative to determine the cause, it is difficult to accurately assess the chance for other family members to have autism.  If more details are available, we are happy to review them.  Fragile X carrier screening could also be considered if desired. The remainder of the family history is unremarkable for birth defects, developmental delays, recurrent pregnancy loss or known chromosome abnormalities.  Ms. Mcbryar stated that this is her 6th pregnancy.  She reported no complications other than mild bleeding, and no exposures that would be expected to increase the risk for birth defects.  After consideration of the options, Ms. Madera elected to proceed with an ultrasound only.  She declined all carrier screening, and was made aware that hemoglobinopathy and CF testing will be performed as part of newborn screening.  SMA and Fragile X testing can also be  elected as part of the Early Check study on the newborn screening if desired.  To register, she may visit [email or web address].  An ultrasound was performed at the time of the visit.  No markers of aneuploidy were noted, but it is important to remember that a normal ultrasound does not exclude the possibility of birth defect or chromosome condition.  Please refer to the ultrasound report for details of that study.  Ms. Woloszyn was encouraged to call with questions or concerns.  We can be contacted at [phone].   Tests Ordered: none   Cherly Anderson, MS, CGC

## 2018-02-19 NOTE — Addendum Note (Signed)
Encounter addended by: Consuelo Pandy, MD on: 02/19/2018 12:30 PM  Actions taken: Sign clinical note

## 2018-02-19 NOTE — Progress Notes (Signed)
Pt seen by me, agree with assessment and plan as outlined in [facility] Wells's note 

## 2018-02-19 NOTE — Progress Notes (Addendum)
MFM consultation   37 yo G6P5005 at 19/6 weeks by LMP c/w US performed at [facility] on 12/25/17; measurements were consistent with 12 weeks 5 days.  Her history is significant for Graves disease and a previous child with Tetrology of Fallot.   Her history of hyperthyoridism dates back to 2017 when she developed lethargy and heat intolerance.  She reports loss of ~60 lbs over the course of the preceding year.  She was initially placed on beta blockers for therapy due to inability to obtain [employer]) other therapy.  She is currently not on therapy and without c/o n/v, tachycardia, cob or other sxs. She still notes fatigue and reports joint pains but has no other c/o.  She denies h/o exopthalmos or goiter that she noted but does report physicians detecting goiter/fullness. She did see [facility] endocrinologist in 2018. Labs at that time Data Review: Results for CHARNE, MCBRIEN (MRN [id number]) as of 02/09/2017 09:38 Ref. Range 02/08/2017 12:01  Beta hCG Quant Latest Ref Range: 0.0 - 5.0 IU/L <5.0  TSH Latest Ref Range: 0.600 - 3.300 uIU/mL <0.015 (L)  T3, Free Latest Ref Range: 2.32 - 6.09 pg/mL >22.80 (H)  Free T4 Latest Ref Range: 0.71 - 1.40 ng/dL 8.11 (H)    Past Medical History:  Diagnosis Date  . Graves' disease 2017   Current Outpatient Medications on File Prior to Encounter  Medication Sig Dispense Refill  . Prenatal-DSS-FeCb-FeGl-FA (CITRANATAL BLOOM) 90-1 MG TABS Take 1 mg by mouth daily. 30 tablet 11   No current facility-administered medications on file prior to encounter.    No Known Allergies  Past Surgical History:  Procedure Laterality Date  . HAND SURGERY Left   . LEG SURGERY Right     OB History    Gravida  6   Para  5   Term  5   Preterm      AB      Living  5     SAB      TAB      Ectopic      Multiple      Live Births  5          44, 37w female, 7lb 5 oz, [facility] Med, no complications 2002 39w, female, 8 lb 15 oz, [facility], TOF--followed  by [facility] 2006 39 weeks, female, 7 lb Alita Chyle, 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[place], currently not working She denies tobacco, etoh or ilicit drug use  Family history +breast cancer (aunts) No HTN,  No DM  Son with TOF, no other fh of birth defects   Vitals:   02/19/18 0920  BP: 130/65  Pulse: 75  Resp: 17  Temp: 98.7 F (37.1 C)  SpO2: 98%    Lab Results  Component Value Date   WBC 5.7 12/04/2017   HGB 12.9 12/04/2017   HCT 37.4 12/04/2017   MCV 86 12/04/2017   PLT 236 12/04/2017   No results found for: ALT, AST, GGT, ALKPHOS, BILITOT  Lab Results  Component Value Date   TSH <0.006 (L) 12/26/2017   Lab Results  Component Value Date   TSH <0.006 (L) 12/26/2017   T4TOTAL >24.9 (HH) 12/26/2017      1. Hyperthyroidism/history of Graves disease---we addressed clinical conditions that may result from poorly controlled thyroid  disease such as: preterm birth, preeclampsia and fetal growth restriction.  Currently she demonstrates signs of moderate hyperthyroidism although she is clinically well compensated (fatigue, heat intolerance are her primary symptoms). We also addressed the risks of fetal goiter and hyperthyroidism.  We addressed the need for her to have Endocrinology follow up and future surveillance after delivery.  Given the severity of her disease, I recommended she initiate methimazole and we discussed the rare association with fetal scalp defects/aplasia cutis as well as maternal rash, hepatotoxicity, n (lower than with PTU), neutropenia, and pruritus.    --methimazole 5 mg daily ordered --recommend she have referral to Endocrinologist for surveillance and postpartum follow up.  --recommend baseline liver function testing (obtained today) and she will call to confirm normal testing  before beginning methimazole --recommend follow up thyroid screening and MFM consultation in 4 weeks --serial growth assessment (we will also assess fetus for sign of goiter/thyroid dysfunction) monthly starting at 24 weeks --antenatal testing weekly starting at ~34 weeks, sooner if clinically indicated (eg if develops IUGR) --plan delivery 39-40 weeks, sooner if clinically indicated  2. AMA--age 62 at delivery.  She had the opportunity to meet with our genetic counselor to address risks for aneuploidy and review her testing/screening options. She previously had cffDNA screening (Mat21) demonstrating low risk for Trisomy 21/13/18.  Her MsAFP was normal.   She had  detailed anatomic screening ultrasound today--please see that report for details.  We will schedule her for fetal echocardiogram due to her history of previous child with TOF and her age related increased risk for congenital heart defect(CHD). We will call her with appointment time/date (She prefers [facility] [place] office).   We addressed the increased risk for preeclampsia and for growth disturbances.  Recommend she begin low dose 'baby' aspirin daily Recommend ultrasound and antenatal testing per Graves recommendations.  Recommend baseline liver function testing, baseline protein creatinine--will obtain today Recommend early screening for GDM  Time spent in consultation: 

## 2018-02-20 NOTE — Telephone Encounter (Signed)
LVM for pt to return call

## 2018-02-21 NOTE — Progress Notes (Signed)
Unable to reach Pt by phone. Sent letter regarding genetic test results.

## 2018-03-08 ENCOUNTER — Other Ambulatory Visit: Payer: Medicaid Other

## 2018-03-08 ENCOUNTER — Encounter: Payer: Medicaid Other | Admitting: Obstetrics and Gynecology

## 2018-03-12 ENCOUNTER — Other Ambulatory Visit: Payer: Self-pay

## 2018-03-12 DIAGNOSIS — O09522 Supervision of elderly multigravida, second trimester: Secondary | ICD-10-CM

## 2018-03-14 ENCOUNTER — Encounter: Payer: Self-pay | Admitting: Obstetrics and Gynecology

## 2018-03-14 ENCOUNTER — Ambulatory Visit (INDEPENDENT_AMBULATORY_CARE_PROVIDER_SITE_OTHER): Payer: Medicaid Other | Admitting: Obstetrics and Gynecology

## 2018-03-14 ENCOUNTER — Ambulatory Visit (INDEPENDENT_AMBULATORY_CARE_PROVIDER_SITE_OTHER): Payer: Medicaid Other

## 2018-03-14 VITALS — BP 146/89 | HR 67 | Wt 208.6 lb

## 2018-03-14 DIAGNOSIS — Z3687 Encounter for antenatal screening for uncertain dates: Secondary | ICD-10-CM | POA: Diagnosis not present

## 2018-03-14 DIAGNOSIS — O99282 Endocrine, nutritional and metabolic diseases complicating pregnancy, second trimester: Secondary | ICD-10-CM

## 2018-03-14 DIAGNOSIS — E0500 Thyrotoxicosis with diffuse goiter without thyrotoxic crisis or storm: Secondary | ICD-10-CM

## 2018-03-14 DIAGNOSIS — Z3482 Encounter for supervision of other normal pregnancy, second trimester: Secondary | ICD-10-CM

## 2018-03-14 DIAGNOSIS — Z131 Encounter for screening for diabetes mellitus: Secondary | ICD-10-CM

## 2018-03-14 DIAGNOSIS — Z130 Encounter for screening for diseases of the blood and blood-forming organs and certain disorders involving the immune mechanism: Secondary | ICD-10-CM

## 2018-03-14 LAB — POCT URINALYSIS DIPSTICK OB
BILIRUBIN UA: NEGATIVE
Blood, UA: NEGATIVE
GLUCOSE, UA: NEGATIVE
KETONES UA: NEGATIVE
Leukocytes, UA: NEGATIVE
Nitrite, UA: NEGATIVE
Spec Grav, UA: 1.015 (ref 1.010–1.025)
Urobilinogen, UA: 0.2 E.U./dL
pH, UA: 6.5 (ref 5.0–8.0)

## 2018-03-14 NOTE — Progress Notes (Signed)
ROB: Patient complains of pelvic pain and pressure, also round ligament pain. Is using a pregnancy band (that she had for her last 2 pregnancies).  Notes that pain is worse at work (patient works at [employer] and has to stand for her shifts).  Desires work notice to be given for restrictions (allowing breaks). Work restriction note given. Patient has been seen by [facility] for Graves disease, recommended maintenance with Endocrinology (however no referral seen in Epic for this), referral placed today. Started on methimazole. Also recommended follow up with in 4 weeks again at [facility] for follow up.  Will contact to make sure that patient gets scheduled. RTC in 4 weeks, for 28 week labs at that time. Will arrange for patient to receive new pregnancy band.    Hildred Laserherry, Child Camp[place], MD [facility]

## 2018-03-14 NOTE — Progress Notes (Signed)
ROB-Pt stated that she has been spotting blood off and on. Pt stated that she is having a lot of lower pelvic pain that will not go away. Pt stated that she has not tried anything for the pain because she do not like taking medication when she is pregnant.

## 2018-03-19 ENCOUNTER — Inpatient Hospital Stay
Admission: RE | Admit: 2018-03-19 | Discharge: 2018-03-19 | Disposition: A | Discharge status: Home / Self Care | Payer: Medicaid Other | Source: Ambulatory Visit

## 2018-03-19 ENCOUNTER — Ambulatory Visit: Payer: Medicaid Other

## 2018-03-19 DIAGNOSIS — E0500 Thyrotoxicosis with diffuse goiter without thyrotoxic crisis or storm: Secondary | ICD-10-CM | POA: Insufficient documentation

## 2018-03-19 NOTE — Progress Notes (Signed)
Pt had scheduled [facility]. at [facility] today at 8:00 am.  Pt did not make the scheduled appt.  Called at 1440 and voicemail left for pt to call back to set up new appt.   [phone]

## 2018-03-22 ENCOUNTER — Telehealth: Payer: Self-pay

## 2018-03-22 NOTE — Telephone Encounter (Signed)
Aimee Nguyen called stating that the pt did not show up for 2 of her appointments with them. she wanted to make sure that the office were aware of the pt's history. Aimee Nguyen was informed that we were aware of her medical history and of her last visit with our office along with her next appointment. Message was sent to [facility] informing them that the pt missed 2 appointments with [facility].

## 2018-03-22 NOTE — Progress Notes (Signed)
Attempted to contact pt again today to rescheduled missed appt from 03/19/18.  Left a voice mail for pt to call [facility] back at [phone].

## 2018-03-22 NOTE — Progress Notes (Signed)
Called Dr. Oretha Milchherry's office and spoke with her nurse. Our office has tried to contact pt to reschedule missed appt on 03/19/18 and also missed appt at [facility] [place] fetal echo.

## 2018-03-29 ENCOUNTER — Telehealth: Payer: Self-pay

## 2018-03-29 NOTE — Telephone Encounter (Signed)
Waiting on pt to call back with information of the doctor she found that she maybe able to see sooner than the one we referred her too.

## 2018-04-17 ENCOUNTER — Encounter: Payer: Self-pay | Admitting: Obstetrics and Gynecology

## 2018-04-17 ENCOUNTER — Ambulatory Visit (INDEPENDENT_AMBULATORY_CARE_PROVIDER_SITE_OTHER): Payer: Medicaid Other | Admitting: Obstetrics and Gynecology

## 2018-04-17 ENCOUNTER — Other Ambulatory Visit: Payer: Medicaid Other

## 2018-04-17 ENCOUNTER — Ambulatory Visit (INDEPENDENT_AMBULATORY_CARE_PROVIDER_SITE_OTHER): Payer: Medicaid Other

## 2018-04-17 VITALS — BP 142/81 | HR 85 | Wt 216.0 lb

## 2018-04-17 DIAGNOSIS — Z3A28 28 weeks gestation of pregnancy: Secondary | ICD-10-CM | POA: Diagnosis not present

## 2018-04-17 DIAGNOSIS — O99283 Endocrine, nutritional and metabolic diseases complicating pregnancy, third trimester: Secondary | ICD-10-CM

## 2018-04-17 DIAGNOSIS — Z3482 Encounter for supervision of other normal pregnancy, second trimester: Secondary | ICD-10-CM

## 2018-04-17 DIAGNOSIS — E0500 Thyrotoxicosis with diffuse goiter without thyrotoxic crisis or storm: Secondary | ICD-10-CM | POA: Diagnosis not present

## 2018-04-17 DIAGNOSIS — Z23 Encounter for immunization: Secondary | ICD-10-CM | POA: Diagnosis not present

## 2018-04-17 LAB — POCT URINALYSIS DIPSTICK OB
Bilirubin, UA: NEGATIVE
Blood, UA: NEGATIVE
Glucose, UA: NEGATIVE
Leukocytes, UA: NEGATIVE
NITRITE UA: NEGATIVE
PH UA: 6.5 (ref 5.0–8.0)
Spec Grav, UA: 1.01 (ref 1.010–1.025)
Urobilinogen, UA: 0.2 E.U./dL

## 2018-04-17 NOTE — Progress Notes (Signed)
Patient comes in today for her 28 week OB visit. She is having some pain in left leg from knee down.

## 2018-04-17 NOTE — Addendum Note (Signed)
Addended by: Smith MinceGWYNN, Andreas Sobolewski J on: 04/17/2018 09:02 AM   Modules accepted: Orders

## 2018-04-17 NOTE — Progress Notes (Signed)
ROB: Doing 1 hour GCT today.  Taking medication as directed but does not have a follow-up [facility] appointment.  Referral for appointment put in today.  Patient not working any longer.  Foot and leg look good today.

## 2018-04-18 LAB — RPR: RPR Ser Ql: NONREACTIVE

## 2018-04-18 LAB — CBC
HEMATOCRIT: 34.1 % (ref 34.0–46.6)
Hemoglobin: 11.8 g/dL (ref 11.1–15.9)
MCH: 30.8 pg (ref 26.6–33.0)
MCHC: 34.6 g/dL (ref 31.5–35.7)
MCV: 89 fL (ref 79–97)
PLATELETS: 167 10*3/uL (ref 150–450)
RBC: 3.83 x10E6/uL (ref 3.77–5.28)
RDW: 13.1 % (ref 12.3–15.4)
WBC: 7.4 10*3/uL (ref 3.4–10.8)

## 2018-04-18 LAB — THYROID PANEL WITH TSH
Free Thyroxine Index: 5.1 — ABNORMAL HIGH (ref 1.2–4.9)
T3 UPTAKE RATIO: 28 % (ref 24–39)
T4 TOTAL: 18.1 ug/dL — AB (ref 4.5–12.0)
TSH: <0.006 u[IU]/mL — ABNORMAL LOW (ref 0.450–4.500)

## 2018-04-18 LAB — GLUCOSE, 1 HOUR GESTATIONAL: GESTATIONAL DIABETES SCREEN: 94 mg/dL (ref 65–139)

## 2018-05-09 ENCOUNTER — Ambulatory Visit (INDEPENDENT_AMBULATORY_CARE_PROVIDER_SITE_OTHER): Payer: Medicaid Other | Admitting: Obstetrics and Gynecology

## 2018-05-09 ENCOUNTER — Other Ambulatory Visit: Payer: Medicaid Other

## 2018-05-09 VITALS — BP 150/80 | HR 84 | Wt 207.7 lb

## 2018-05-09 DIAGNOSIS — O0993 Supervision of high risk pregnancy, unspecified, third trimester: Secondary | ICD-10-CM

## 2018-05-09 DIAGNOSIS — O26893 Other specified pregnancy related conditions, third trimester: Secondary | ICD-10-CM

## 2018-05-09 DIAGNOSIS — B951 Streptococcus, group B, as the cause of diseases classified elsewhere: Secondary | ICD-10-CM | POA: Insufficient documentation

## 2018-05-09 DIAGNOSIS — O09523 Supervision of elderly multigravida, third trimester: Secondary | ICD-10-CM

## 2018-05-09 DIAGNOSIS — Z111 Encounter for screening for respiratory tuberculosis: Secondary | ICD-10-CM

## 2018-05-09 DIAGNOSIS — O09299 Supervision of pregnancy with other poor reproductive or obstetric history, unspecified trimester: Secondary | ICD-10-CM | POA: Insufficient documentation

## 2018-05-09 DIAGNOSIS — Z8632 Personal history of gestational diabetes: Secondary | ICD-10-CM

## 2018-05-09 DIAGNOSIS — E0500 Thyrotoxicosis with diffuse goiter without thyrotoxic crisis or storm: Secondary | ICD-10-CM

## 2018-05-09 DIAGNOSIS — R102 Pelvic and perineal pain: Secondary | ICD-10-CM

## 2018-05-09 LAB — POCT URINALYSIS DIPSTICK OB
Bilirubin, UA: NEGATIVE
GLUCOSE, UA: NEGATIVE
Ketones, UA: NEGATIVE
LEUKOCYTES UA: NEGATIVE
NITRITE UA: NEGATIVE
SPEC GRAV UA: 1.02 (ref 1.010–1.025)
Urobilinogen, UA: 0.2 E.U./dL
pH, UA: 6.5 (ref 5.0–8.0)

## 2018-05-09 NOTE — Progress Notes (Signed)
ROB-pt stated that she is having a lot of pelvic pain that is causing a lot of issues with sleeping. Pt stated that she is bleeding again x 3 days and noticed that she had to change her pad after 4 hours of wearing it. Pt stated that she felt sick that caused her not to eat and vomiting for 2 days with a fever and felt weak afterwards.

## 2018-05-09 NOTE — Patient Instructions (Signed)
Nonstress Test °A nonstress test is a procedure that is done during pregnancy in order to check the baby's heartbeat. The procedure can help show if the baby (fetus) is healthy. It is commonly done when: °· The baby is past his or her due date. °· The pregnancy is high risk. °· The baby is moving less than normal. °· The mother has lost a pregnancy in the past. °· The health care provider suspects a problem with the baby's growth. °· There is too much or too little amniotic fluid. °The procedure is often done in the third trimester of pregnancy to find out if an early delivery is needed and whether such a delivery is safe. °During a nonstress test, the baby's heartbeat is monitored when the baby is resting and when the baby is moving. If the baby is healthy, the heart rate will increase when he or she moves or kicks and will return to normal when he or she rests. °Tell a health care provider about: °· Any allergies you have. °· Any medical conditions you have. °· All medicines you are taking, including vitamins, herbs, eye drops, creams, and over-the-counter medicines. °What are the risks? °There are no risks to you or your baby from a nonstress test. This procedure should not be painful or uncomfortable. °What happens before the procedure? °· Eat a meal right before the test or as directed by your health care provider. Food may help encourage the baby to move. °· Use the restroom right before the test. °What happens during the procedure? °· Two monitors will be placed on your abdomen. One will record the baby's heart rate and the other will record the contractions of your uterus. °· You may be asked to lie down on your side or to sit upright. °· You may be given a button to press when you feel your baby move. °· Your health care provider will listen to your baby's heartbeat and recorded it. He or she may also watch your baby's heartbeat on a screen. °· If the baby seems to be sleeping, you may be asked to drink  some juice or soda, eat a snack, or change positions. °The procedure may vary among health care providers and hospitals. °What happens after the procedure? °· Your health care provider will discuss the test results with you and make recommendations for the future. Depending on the results, your health care provider may order additional tests or another course of action. °· If your health care provider gave you any diet or activity instructions, make sure to follow them. °· Keep all follow-up visits as told by your health care provider. This is important. °Summary °· A nonstress test is a procedure that is done during pregnancy in order to check the baby's heartbeat. The procedure can help show if the baby is healthy. °· The procedure is often done in the third trimester of pregnancy to find out if an early delivery is needed and whether such a delivery is safe. °· During a nonstress test, the baby's heartbeat is monitored when the baby is resting and when the baby is moving. If the baby is healthy, the heart rate will increase when he or she moves or kicks and will return to normal when he or she rests. °· Your health care provider will discuss the test results with you and make recommendations for the future. °This information is not intended to replace advice given to you by your health care provider. Make sure you discuss any   questions you have with your health care provider. °Document Released: 03/25/2002 Document Revised: 07/14/2016 Document Reviewed: 07/14/2016 °Elsevier Interactive Patient Education © 2019 [employer] ° °

## 2018-05-10 ENCOUNTER — Telehealth: Payer: Self-pay

## 2018-05-10 NOTE — Progress Notes (Signed)
ROB: Patient complains of pelvic discomfort and pressure, greater on right side. Is wearing pregnancy band but still noting discomfort. Notes she doesn't really like to take medicine. Discussed use of birthing ball, warm baths, can refer to pelvic floor physical therapy if desired.   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 Notes that she is also now more compliant with her thyroid medication. Will repeat thyroid levels today. Will need growth scan next visit, and to begin antenatal testing for h/o thyroid disease. RTC in 2 weeks.

## 2018-05-10 NOTE — Telephone Encounter (Signed)
Pt called no answer LM that her paperwork that she left is completed, signed and ready for pick up.

## 2018-05-12 LAB — QUANTIFERON-TB GOLD PLUS
QUANTIFERON NIL VALUE: 0.02 [IU]/mL
QUANTIFERON TB1 AG VALUE: 0.02 [IU]/mL
QUANTIFERON TB2 AG VALUE: 0.02 [IU]/mL
QuantiFERON-TB Gold Plus: NEGATIVE

## 2018-05-12 LAB — THYROID PANEL WITH TSH
Free Thyroxine Index: 7.3 — ABNORMAL HIGH (ref 1.2–4.9)
T3 Uptake Ratio: 33 % (ref 24–39)
T4, Total: 22.2 ug/dL (ref 4.5–12.0)
TSH: <0.006 u[IU]/mL — ABNORMAL LOW (ref 0.450–4.500)

## 2018-05-16 ENCOUNTER — Other Ambulatory Visit: Payer: Medicaid Other

## 2018-05-16 ENCOUNTER — Ambulatory Visit (INDEPENDENT_AMBULATORY_CARE_PROVIDER_SITE_OTHER): Payer: Medicaid Other | Admitting: Obstetrics and Gynecology

## 2018-05-16 ENCOUNTER — Ambulatory Visit (INDEPENDENT_AMBULATORY_CARE_PROVIDER_SITE_OTHER): Payer: Medicaid Other

## 2018-05-16 VITALS — BP 164/92 | HR 73 | Wt 212.8 lb

## 2018-05-16 DIAGNOSIS — O09523 Supervision of elderly multigravida, third trimester: Secondary | ICD-10-CM | POA: Diagnosis not present

## 2018-05-16 DIAGNOSIS — E0500 Thyrotoxicosis with diffuse goiter without thyrotoxic crisis or storm: Secondary | ICD-10-CM

## 2018-05-16 DIAGNOSIS — Z3A32 32 weeks gestation of pregnancy: Secondary | ICD-10-CM

## 2018-05-16 DIAGNOSIS — O99283 Endocrine, nutritional and metabolic diseases complicating pregnancy, third trimester: Secondary | ICD-10-CM | POA: Diagnosis not present

## 2018-05-16 DIAGNOSIS — O0993 Supervision of high risk pregnancy, unspecified, third trimester: Secondary | ICD-10-CM

## 2018-05-16 NOTE — Progress Notes (Signed)
Patient ID: Aimee Nguyen, female   DOB: 27-Mar-1981, 38 y.o.   MRN: [id number]   NONSTRESS TEST INTERPRETATION  INDICATIONS: AMA - HTN  RESULTS:  reactive COMMENTS:   U/S as ordered today  PLAN: 1. Continue fetal kick counts as directed. 2. Continue antepartum testing as scheduled.  Elonda Husky, M.D. 05/16/2018 4:24 PM

## 2018-05-16 NOTE — Progress Notes (Signed)
ROB-Pt stated that she is having pelvic pain a lot.

## 2018-05-17 ENCOUNTER — Other Ambulatory Visit: Payer: Self-pay

## 2018-05-17 MED ORDER — METHIMAZOLE 5 MG PO TABS: 5.0000 mg | ORAL_TABLET | Freq: Two times a day (BID) | ORAL | 3 refills | Status: DC

## 2018-05-23 ENCOUNTER — Encounter: Payer: Self-pay | Admitting: Obstetrics and Gynecology

## 2018-05-23 ENCOUNTER — Other Ambulatory Visit: Payer: Medicaid Other

## 2018-05-23 ENCOUNTER — Ambulatory Visit (INDEPENDENT_AMBULATORY_CARE_PROVIDER_SITE_OTHER): Payer: Medicaid Other | Admitting: Obstetrics and Gynecology

## 2018-05-23 ENCOUNTER — Other Ambulatory Visit: Payer: Self-pay

## 2018-05-23 VITALS — BP 134/65 | HR 75 | Wt 213.0 lb

## 2018-05-23 DIAGNOSIS — O26893 Other specified pregnancy related conditions, third trimester: Secondary | ICD-10-CM | POA: Diagnosis not present

## 2018-05-23 DIAGNOSIS — R102 Pelvic and perineal pain: Secondary | ICD-10-CM

## 2018-05-23 DIAGNOSIS — Z3A33 33 weeks gestation of pregnancy: Secondary | ICD-10-CM

## 2018-05-23 DIAGNOSIS — O26853 Spotting complicating pregnancy, third trimester: Secondary | ICD-10-CM | POA: Diagnosis not present

## 2018-05-23 DIAGNOSIS — E0500 Thyrotoxicosis with diffuse goiter without thyrotoxic crisis or storm: Secondary | ICD-10-CM

## 2018-05-23 DIAGNOSIS — O99283 Endocrine, nutritional and metabolic diseases complicating pregnancy, third trimester: Secondary | ICD-10-CM

## 2018-05-23 DIAGNOSIS — O0993 Supervision of high risk pregnancy, unspecified, third trimester: Secondary | ICD-10-CM

## 2018-05-23 LAB — POCT URINALYSIS DIPSTICK OB
BILIRUBIN UA: NEGATIVE
GLUCOSE, UA: NEGATIVE
Ketones, UA: NEGATIVE
LEUKOCYTES UA: NEGATIVE
Nitrite, UA: NEGATIVE
PH UA: 6 (ref 5.0–8.0)
Spec Grav, UA: 1.02 (ref 1.010–1.025)
UROBILINOGEN UA: 0.2 U/dL

## 2018-05-23 NOTE — Progress Notes (Signed)
ROB: Patient with usual complaints of pelvic pressure and occasional spotting.  She says nothing has changed she still feels the same.  Endocrinology referral again entered.  Patient states she is taking her medicine as directed.  Follow-up with weekly NSTs.   NST Reactive

## 2018-05-23 NOTE — Progress Notes (Signed)
Rob and NST.   C/p of Pelvic pressure.

## 2018-05-30 ENCOUNTER — Telehealth: Payer: Self-pay

## 2018-05-30 ENCOUNTER — Other Ambulatory Visit: Payer: Medicaid Other

## 2018-05-30 ENCOUNTER — Encounter: Payer: Self-pay | Admitting: Obstetrics and Gynecology

## 2018-05-30 ENCOUNTER — Ambulatory Visit (INDEPENDENT_AMBULATORY_CARE_PROVIDER_SITE_OTHER): Payer: Medicaid Other | Admitting: Obstetrics and Gynecology

## 2018-05-30 VITALS — BP 134/75 | HR 76 | Wt 217.3 lb

## 2018-05-30 DIAGNOSIS — E0500 Thyrotoxicosis with diffuse goiter without thyrotoxic crisis or storm: Secondary | ICD-10-CM

## 2018-05-30 DIAGNOSIS — Z3A34 34 weeks gestation of pregnancy: Secondary | ICD-10-CM | POA: Diagnosis not present

## 2018-05-30 DIAGNOSIS — O99283 Endocrine, nutritional and metabolic diseases complicating pregnancy, third trimester: Secondary | ICD-10-CM | POA: Diagnosis not present

## 2018-05-30 DIAGNOSIS — Z3483 Encounter for supervision of other normal pregnancy, third trimester: Secondary | ICD-10-CM

## 2018-05-30 LAB — POCT URINALYSIS DIPSTICK OB
Bilirubin, UA: NEGATIVE
Blood, UA: NEGATIVE
GLUCOSE, UA: NEGATIVE
KETONES UA: NEGATIVE
Leukocytes, UA: NEGATIVE
NITRITE UA: NEGATIVE
SPEC GRAV UA: 1.02 (ref 1.010–1.025)
Urobilinogen, UA: 0.2 E.U./dL
pH, UA: 6.5 (ref 5.0–8.0)

## 2018-05-30 NOTE — Progress Notes (Signed)
ROB-Pt present today for nurse visit and NST. Pt stated that she had a really bad headache. Pt's bp was 134/75. Pt stated that she had breakfast this morning. No other problems.

## 2018-05-30 NOTE — Telephone Encounter (Signed)
Cm spoke with member per request of Dr. Amalia Hailey to follow up with Endocrinology referral.  Prior to speaking with member, cm f/u with Aimee Nguyen who informed CM that [facility] notated on member's chart that an attempt to contact member via telephone was made, however member did not answer her call.  CM also contact [facility] Endocrin[facility] office and spoke with "Aimee Nguyen" who informed this cm that they received member's referral on 05/24/2018 and the referral is currently pending on review of Provider in their office.  Per "Aimee Nguyen", member will be contacted there after.  Informed Dr. Amalia Hailey of aforementioned information.  CM met with member with aforementioned information.  Member reports that she did not receive any calls.  Encouraged member to look out for a call from [facility] Endocrinology.  Member agreeable. Member reports that all telephone contact information is updated in her chart.  CM followed up with Aimee Nguyen and per Aimee Nguyen referral to [facility] and [place] received member's updated demographics information.

## 2018-05-30 NOTE — Telephone Encounter (Signed)
Pt called no answer LM via voicemail that her tb test results had been printed off and placed at the front office for pick up.

## 2018-05-30 NOTE — Progress Notes (Signed)
ROB:  NST reactive.  Still has not had referral to endocrinology.  Aimee Nguyen involved.  They should contact her with an appointment.  She states she is taking her medicine as directed.

## 2018-05-31 NOTE — Telephone Encounter (Signed)
See previous note with patient regarding her endocrinology appointments.

## 2018-06-06 ENCOUNTER — Other Ambulatory Visit: Payer: Medicaid Other

## 2018-06-06 ENCOUNTER — Ambulatory Visit (INDEPENDENT_AMBULATORY_CARE_PROVIDER_SITE_OTHER): Payer: Medicaid Other | Admitting: Obstetrics and Gynecology

## 2018-06-06 ENCOUNTER — Encounter: Payer: Self-pay | Admitting: Obstetrics and Gynecology

## 2018-06-06 VITALS — BP 126/75 | HR 67 | Ht 67.0 in | Wt 221.7 lb

## 2018-06-06 DIAGNOSIS — O0993 Supervision of high risk pregnancy, unspecified, third trimester: Secondary | ICD-10-CM

## 2018-06-06 DIAGNOSIS — Z3483 Encounter for supervision of other normal pregnancy, third trimester: Secondary | ICD-10-CM | POA: Diagnosis not present

## 2018-06-06 DIAGNOSIS — E0500 Thyrotoxicosis with diffuse goiter without thyrotoxic crisis or storm: Secondary | ICD-10-CM

## 2018-06-06 DIAGNOSIS — O09523 Supervision of elderly multigravida, third trimester: Secondary | ICD-10-CM

## 2018-06-06 LAB — POCT URINALYSIS DIPSTICK OB
BILIRUBIN UA: NEGATIVE
Glucose, UA: NEGATIVE
Ketones, UA: NEGATIVE
Leukocytes, UA: NEGATIVE
NITRITE UA: NEGATIVE
PH UA: 6.5 (ref 5.0–8.0)
PROTEIN: NEGATIVE
Spec Grav, UA: 1.01 (ref 1.010–1.025)
UROBILINOGEN UA: 0.2 U/dL

## 2018-06-06 NOTE — Progress Notes (Signed)
ROB: Patient kept her appointment at endocrinology.  Dose of methimazole changed.  Patient has follow-up appointment scheduled.  NST today reactive.  NST and cultures next week.

## 2018-06-06 NOTE — Progress Notes (Signed)
Patient comes in today for ROB and NST. No concerns today.  

## 2018-06-13 ENCOUNTER — Ambulatory Visit (INDEPENDENT_AMBULATORY_CARE_PROVIDER_SITE_OTHER): Payer: Medicaid Other | Admitting: Obstetrics and Gynecology

## 2018-06-13 ENCOUNTER — Other Ambulatory Visit: Payer: Medicaid Other

## 2018-06-13 ENCOUNTER — Encounter: Payer: Self-pay | Admitting: Obstetrics and Gynecology

## 2018-06-13 VITALS — BP 148/88 | HR 71 | Wt 226.4 lb

## 2018-06-13 DIAGNOSIS — B951 Streptococcus, group B, as the cause of diseases classified elsewhere: Secondary | ICD-10-CM

## 2018-06-13 DIAGNOSIS — O09293 Supervision of pregnancy with other poor reproductive or obstetric history, third trimester: Secondary | ICD-10-CM

## 2018-06-13 DIAGNOSIS — O99283 Endocrine, nutritional and metabolic diseases complicating pregnancy, third trimester: Secondary | ICD-10-CM | POA: Diagnosis not present

## 2018-06-13 DIAGNOSIS — O09299 Supervision of pregnancy with other poor reproductive or obstetric history, unspecified trimester: Secondary | ICD-10-CM

## 2018-06-13 DIAGNOSIS — Z3A36 36 weeks gestation of pregnancy: Secondary | ICD-10-CM

## 2018-06-13 DIAGNOSIS — E0500 Thyrotoxicosis with diffuse goiter without thyrotoxic crisis or storm: Secondary | ICD-10-CM

## 2018-06-13 DIAGNOSIS — O99820 Streptococcus B carrier state complicating pregnancy: Secondary | ICD-10-CM | POA: Diagnosis not present

## 2018-06-13 DIAGNOSIS — O09523 Supervision of elderly multigravida, third trimester: Secondary | ICD-10-CM | POA: Diagnosis not present

## 2018-06-13 DIAGNOSIS — Z8632 Personal history of gestational diabetes: Secondary | ICD-10-CM

## 2018-06-13 LAB — POCT URINALYSIS DIPSTICK OB
Bilirubin, UA: NEGATIVE
Blood, UA: NEGATIVE
GLUCOSE, UA: NEGATIVE
Ketones, UA: NEGATIVE
LEUKOCYTES UA: NEGATIVE
NITRITE UA: NEGATIVE
Urobilinogen, UA: 0.2 E.U./dL
pH, UA: 6.5 (ref 5.0–8.0)

## 2018-06-13 NOTE — Progress Notes (Signed)
ROB-Pt stated that she was having some pain and pressure in the lower back and abd.
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
## 2018-06-14 NOTE — Addendum Note (Signed)
Addended by: Marchelle Folks on: 06/14/2018 03:29 PM   Modules accepted: Orders

## 2018-06-15 LAB — STREP GP B NAA: Strep Gp B NAA: POSITIVE — AB

## 2018-06-16 LAB — GC/CHLAMYDIA PROBE AMP
Chlamydia trachomatis, NAA: NEGATIVE
Neisseria gonorrhoeae by PCR: NEGATIVE

## 2018-06-19 ENCOUNTER — Ambulatory Visit: Payer: Medicaid Other

## 2018-06-20 ENCOUNTER — Encounter: Payer: Self-pay | Admitting: Obstetrics and Gynecology

## 2018-06-20 ENCOUNTER — Encounter: Payer: Medicaid Other | Admitting: Obstetrics and Gynecology

## 2018-06-20 ENCOUNTER — Ambulatory Visit (INDEPENDENT_AMBULATORY_CARE_PROVIDER_SITE_OTHER): Payer: Medicaid Other | Admitting: Obstetrics and Gynecology

## 2018-06-20 ENCOUNTER — Other Ambulatory Visit: Payer: Medicaid Other

## 2018-06-20 VITALS — BP 114/69 | HR 77 | Ht 67.0 in | Wt 227.0 lb

## 2018-06-20 DIAGNOSIS — E0500 Thyrotoxicosis with diffuse goiter without thyrotoxic crisis or storm: Secondary | ICD-10-CM

## 2018-06-20 DIAGNOSIS — O09523 Supervision of elderly multigravida, third trimester: Secondary | ICD-10-CM | POA: Diagnosis not present

## 2018-06-20 LAB — POCT URINALYSIS DIPSTICK OB
Bilirubin, UA: NEGATIVE
GLUCOSE, UA: NEGATIVE
Ketones, UA: NEGATIVE
LEUKOCYTES UA: NEGATIVE
NITRITE UA: NEGATIVE
PH UA: 6.5 (ref 5.0–8.0)
SPEC GRAV UA: 1.01 (ref 1.010–1.025)
Urobilinogen, UA: 0.2 E.U./dL

## 2018-06-20 NOTE — Progress Notes (Signed)
ROB:  No complaints - she is taking thyroid meds as directed.  Denies contractions.  NST Reactive.

## 2018-06-20 NOTE — Progress Notes (Signed)
Patient comes in today for NST and ROB visit. Patient is having lower and mid back pain.

## 2018-06-21 ENCOUNTER — Ambulatory Visit
Admission: RE | Admit: 2018-06-21 | Discharge: 2018-06-21 | Disposition: A | Discharge status: Home / Self Care | Payer: Medicaid Other | Source: Ambulatory Visit | Attending: Obstetrics and Gynecology | Admitting: Obstetrics and Gynecology

## 2018-06-21 DIAGNOSIS — O09523 Supervision of elderly multigravida, third trimester: Secondary | ICD-10-CM | POA: Diagnosis not present

## 2018-06-21 DIAGNOSIS — O09299 Supervision of pregnancy with other poor reproductive or obstetric history, unspecified trimester: Secondary | ICD-10-CM | POA: Diagnosis present

## 2018-06-21 DIAGNOSIS — Z8632 Personal history of gestational diabetes: Secondary | ICD-10-CM | POA: Diagnosis present

## 2018-06-27 ENCOUNTER — Other Ambulatory Visit: Payer: Medicaid Other

## 2018-06-27 ENCOUNTER — Encounter: Payer: Medicaid Other | Admitting: Obstetrics and Gynecology

## 2018-06-27 ENCOUNTER — Telehealth: Payer: Self-pay | Admitting: Obstetrics and Gynecology

## 2018-06-27 NOTE — Telephone Encounter (Signed)
Discussed with Dr. Valentino Saxon.
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
## 2018-07-03 ENCOUNTER — Telehealth: Payer: Self-pay | Admitting: Obstetrics and Gynecology

## 2018-07-03 NOTE — Telephone Encounter (Signed)
The nurse called from the [facility] for this patient and asked that Dr. Logan Bores call and ask for Dr. Ennis Forts, as he would like to discuss this patient's pre-eclampsia history, please advise, thanks.

## 2018-07-05 ENCOUNTER — Ambulatory Visit (INDEPENDENT_AMBULATORY_CARE_PROVIDER_SITE_OTHER): Payer: Medicaid Other | Admitting: Obstetrics and Gynecology

## 2018-07-05 ENCOUNTER — Encounter: Payer: Self-pay | Admitting: Obstetrics and Gynecology

## 2018-07-05 ENCOUNTER — Other Ambulatory Visit: Payer: Self-pay

## 2018-07-05 NOTE — Progress Notes (Signed)
HPI:      Ms. LARISSA OLLIVIERRE is a 38 y.o. [id number] who LMP was Patient's last menstrual period was 10/03/2017 (exact date).  Subjective:   She preseNts today for a blood pressure check.  She states that her blood pressure was repeatedly checked when she was in the hospital but she was sent home without any medication for it.  She reports that the swelling has gone down significantly since her discharge and she only has a small amount left in her left ankle.  She denies other signs or symptoms or problems.  She is bottlefeeding.  She is social distancing/self quarantine at this time with her baby.    Hx: The following portions of the patient's history were reviewed and updated as appropriate:             She  has a past medical history of Graves' disease (2017). She does not have any pertinent problems on file. She  has a past surgical history that includes Hand surgery (Left) and Leg Surgery (Right). Her family history is not on file. She  reports that she has never smoked. She has never used smokeless tobacco. She reports previous alcohol use. She reports previous drug use. She has a current medication list which includes the following prescription(s): methimazole and citranatal bloom. She has No Known Allergies.       Review of Systems:  Review of Systems  Constitutional: Denied constitutional symptoms, night sweats, recent illness, fatigue, fever, insomnia and weight loss.  Eyes: Denied eye symptoms, eye pain, photophobia, vision change and visual disturbance.  Ears/Nose/Throat/Neck: Denied ear, nose, throat or neck symptoms, hearing loss, nasal discharge, sinus congestion and sore throat.  Cardiovascular: Denied cardiovascular symptoms, arrhythmia, chest pain/pressure, edema, exercise intolerance, orthopnea and palpitations.  Respiratory: Denied pulmonary symptoms, asthma, pleuritic pain, productive sputum, cough, dyspnea and wheezing.  Gastrointestinal: Denied, gastro-esophageal reflux,  melena, nausea and vomiting.  Genitourinary: Denied genitourinary symptoms including symptomatic vaginal discharge, pelvic relaxation issues, and urinary complaints.  Musculoskeletal: Denied musculoskeletal symptoms, stiffness, swelling, muscle weakness and myalgia.  Dermatologic: Denied dermatology symptoms, rash and scar.  Neurologic: Denied neurology symptoms, dizziness, headache, neck pain and syncope.  Psychiatric: Denied psychiatric symptoms, anxiety and depression.  Endocrine: Denied endocrine symptoms including hot flashes and night sweats.   Meds:   Current Outpatient Medications on File Prior to Visit  Medication Sig Dispense Refill  . methimazole (TAPAZOLE) 5 MG tablet Take 1 tablet (5 mg total) by mouth 2 (two) times daily for 30 days. (Patient taking differently: Take 20 mg by mouth 2 (two) times daily. ) 60 tablet 3  . Prenatal-DSS-FeCb-FeGl-FA (CITRANATAL BLOOM) 90-1 MG TABS Take 1 mg by mouth daily. 30 tablet 11   No current facility-administered medications on file prior to visit.     Objective:     Vitals:   07/05/18 1044  BP: 139/81  Pulse: 84                Assessment:    G6P5005 Patient Active Problem List   Diagnosis Date Noted  . History of gestational diabetes in prior pregnancy, currently pregnant 05/09/2018  . Positive GBS test 05/09/2018  . Graves disease 03/19/2018  . AMA (advanced maternal age) multigravida 35+, third trimester   . Family history of congenital heart defect      1. Postpartum care and examination immediately after delivery     Patient's blood pressure at good levels today.  No signs of postpartum preeclampsia.   Plan:  1.  Signs and symptoms of preeclampsia discussed with the patient she is to inform us if her condition changes.  2.  Follow-up in 4 to 5 weeks Orders No orders of the defined types were placed in this encounter.   No orders of the defined types were placed in this encounter.     F/U  No  follow-ups on file.  Elonda Husky, M.D. 07/05/2018 10:59 AM

## 2018-07-26 ENCOUNTER — Encounter: Payer: Medicaid Other | Admitting: Obstetrics and Gynecology

## 2018-08-01 ENCOUNTER — Encounter: Payer: Medicaid Other | Admitting: Obstetrics and Gynecology

## 2018-08-09 ENCOUNTER — Telehealth: Payer: Self-pay

## 2018-08-09 NOTE — Telephone Encounter (Signed)
Pt called no answer LM via voicemail to call the office to go over prescreening for COVID-19 before her visit tomorrow.

## 2018-08-09 NOTE — Telephone Encounter (Signed)
Pt called no answer LM to call the office to do prescreening before her visit tomorrow. 

## 2018-08-10 ENCOUNTER — Encounter: Payer: Self-pay | Admitting: Obstetrics and Gynecology

## 2018-08-14 ENCOUNTER — Ambulatory Visit (INDEPENDENT_AMBULATORY_CARE_PROVIDER_SITE_OTHER): Payer: Medicaid Other | Admitting: Obstetrics and Gynecology

## 2018-08-14 ENCOUNTER — Encounter: Payer: Self-pay | Admitting: Obstetrics and Gynecology

## 2018-08-14 ENCOUNTER — Other Ambulatory Visit: Payer: Self-pay

## 2018-08-14 DIAGNOSIS — E0500 Thyrotoxicosis with diffuse goiter without thyrotoxic crisis or storm: Secondary | ICD-10-CM

## 2018-08-14 DIAGNOSIS — Z3009 Encounter for other general counseling and advice on contraception: Secondary | ICD-10-CM

## 2018-08-14 DIAGNOSIS — Z30011 Encounter for initial prescription of contraceptive pills: Secondary | ICD-10-CM

## 2018-08-14 MED ORDER — DESOGESTREL-ETHINYL ESTRADIOL 0.15-0.02/0.01 MG (21/5) PO TABS: 1.0000 | ORAL_TABLET | Freq: Every day | ORAL | 2 refills | Status: DC
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
## 2018-08-14 NOTE — Progress Notes (Signed)
Patient comes in today for PPV. She has had intercourse without using BC since giving birth. She would like to take [facility] pills. She is bottle feeding.

## 2018-08-27 ENCOUNTER — Ambulatory Visit: Payer: Medicaid Other | Admitting: Internal Medicine
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
## 2018-09-04 ENCOUNTER — Telehealth: Payer: Self-pay

## 2018-09-04 NOTE — Telephone Encounter (Signed)
Pt called no answer LM to call the office for prescreening for COVID-19.

## 2018-09-05 ENCOUNTER — Encounter: Payer: Medicaid Other | Admitting: Obstetrics and Gynecology

## 2018-10-02 ENCOUNTER — Encounter (HOSPITAL_COMMUNITY): Payer: Self-pay

## 2018-11-13 ENCOUNTER — Ambulatory Visit (INDEPENDENT_AMBULATORY_CARE_PROVIDER_SITE_OTHER): Payer: Medicaid Other | Admitting: Obstetrics and Gynecology

## 2018-11-13 ENCOUNTER — Encounter: Payer: Self-pay | Admitting: Obstetrics and Gynecology

## 2018-11-13 ENCOUNTER — Other Ambulatory Visit: Payer: Self-pay

## 2018-11-13 DIAGNOSIS — Z3009 Encounter for other general counseling and advice on contraception: Secondary | ICD-10-CM

## 2018-11-13 DIAGNOSIS — Z30011 Encounter for initial prescription of contraceptive pills: Secondary | ICD-10-CM | POA: Diagnosis not present

## 2018-11-13 MED ORDER — DESOGESTREL-ETHINYL ESTRADIOL 0.15-0.02/0.01 MG (21/5) PO TABS: 1.0000 | ORAL_TABLET | Freq: Every day | ORAL | 3 refills | Status: AC

## 2018-11-13 NOTE — Progress Notes (Signed)
Patient comes in today for her birth control pills.

## 2018-11-13 NOTE — Progress Notes (Signed)
HPI:      Aimee Nguyen is a 38 y.o. [id number] who LMP was Patient's last menstrual period was 10/30/2018.  Subjective:   She presents today for follow-up of OCPs and blood pressure check.  Patient is currently off all blood pressure medications.  She took birth control pills for 2 months and then made a mistake in her birth control pill pack.  She is now off of pills but would like to restart them. She is bottlefeeding She saw her endocrinologist approximately 2 weeks ago and has blood work pending she plans to contact them later today.    Hx: The following portions of the patient's history were reviewed and updated as appropriate:             She  has a past medical history of Graves' disease (2017). She does not have any pertinent problems on file. She  has a past surgical history that includes Hand surgery (Left) and Leg Surgery (Right). Her family history is not on file. She  reports that she has never smoked. She has never used smokeless tobacco. She reports previous alcohol use. She reports previous drug use. She has a current medication list which includes the following prescription(s): desogestrel-ethinyl estradiol and methimazole. She has No Known Allergies.       Review of Systems:  Review of Systems  Constitutional: Denied constitutional symptoms, night sweats, recent illness, fatigue, fever, insomnia and weight loss.  Eyes: Denied eye symptoms, eye pain, photophobia, vision change and visual disturbance.  Ears/Nose/Throat/Neck: Denied ear, nose, throat or neck symptoms, hearing loss, nasal discharge, sinus congestion and sore throat.  Cardiovascular: Denied cardiovascular symptoms, arrhythmia, chest pain/pressure, edema, exercise intolerance, orthopnea and palpitations.  Respiratory: Denied pulmonary symptoms, asthma, pleuritic pain, productive sputum, cough, dyspnea and wheezing.  Gastrointestinal: Denied, gastro-esophageal reflux, melena, nausea and vomiting.   Genitourinary: Denied genitourinary symptoms including symptomatic vaginal discharge, pelvic relaxation issues, and urinary complaints.  Musculoskeletal: Denied musculoskeletal symptoms, stiffness, swelling, muscle weakness and myalgia.  Dermatologic: Denied dermatology symptoms, rash and scar.  Neurologic: Denied neurology symptoms, dizziness, headache, neck pain and syncope.  Psychiatric: Denied psychiatric symptoms, anxiety and depression.  Endocrine: Denied endocrine symptoms including hot flashes and night sweats.   Meds:   Current Outpatient Medications on File Prior to Visit  Medication Sig Dispense Refill  . methimazole (TAPAZOLE) 10 MG tablet Take by mouth.     No current facility-administered medications on file prior to visit.     Objective:     Vitals:   11/13/18 1106  BP: 137/86  Pulse: 64                Assessment:    [id number] Patient Active Problem List   Diagnosis Date Noted  . History of gestational diabetes in prior pregnancy, currently pregnant 05/09/2018  . Positive GBS test 05/09/2018  . Graves disease 03/19/2018  . AMA (advanced maternal age) multigravida 50+, third trimester   . Family history of congenital heart defect      1. Postpartum care and examination immediately after delivery   2. Birth control counseling   3. Initiation of OCP (BCP)        Plan:            1.  Restart OCPs with next menstrual.-Discussed in detail with patient  2.  Strongly recommend follow-up with endocrinologist for hypothyroidism  3.  Follow-up for annual examination Orders No orders of the defined types were placed in this encounter.  Meds ordered this encounter  Medications  . desogestrel-ethinyl estradiol (MIRCETTE) 0.15-0.02/0.01 MG (21/5) tablet    Sig: Take 1 tablet by mouth at bedtime.    Dispense:  3 Package    Refill:  3      F/U  Return for Annual Physical. I spent 16 minutes involved in the care of this patient of which greater than 50%  was spent discussing OCP use, hypertension and OCPs, hypothyroidism, follow-ups.  All questions answered.  Elonda Huskyavid J. , M.D. 11/13/2018 11:28 AM

## 2018-12-05 ENCOUNTER — Encounter: Payer: Medicaid Other | Admitting: Obstetrics and Gynecology

## 2019-04-26 ENCOUNTER — Telehealth: Payer: Self-pay

## 2019-04-26 NOTE — Telephone Encounter (Signed)
Pt called number did not answer nor picked up it was just air each time I dialed it.  Information was printed off and waiting on pt to call back. Will contact pt on Monday.

## 2019-04-26 NOTE — Telephone Encounter (Signed)
Patient called to request a copy of the TB test, Dr. Valentino Saxon ordered last summer. Patient says she needs the copy for her job. Would like Geraldo Pitter to call her once form is ready to be picked up.

## 2019-05-02 NOTE — Telephone Encounter (Signed)
Pt is aware that her test result are available up front for pick up.

## 2022-01-28 ENCOUNTER — Encounter: Payer: Self-pay | Admitting: Obstetrics and Gynecology

## 2022-01-31 ENCOUNTER — Encounter: Payer: Self-pay | Admitting: Obstetrics and Gynecology
